# Patient Record
Sex: Male | Born: 1995 | Race: Black or African American | Hispanic: No | Marital: Single
Health system: Southern US, Community
[De-identification: ages and names within clinical notes are randomized; demographics above are authoritative.]

## PROBLEM LIST (undated history)

## (undated) DIAGNOSIS — G43909 Migraine, unspecified, not intractable, without status migrainosus: Secondary | ICD-10-CM

## (undated) HISTORY — PX: OTHER SURGICAL HISTORY: SHX169

## (undated) HISTORY — PX: TONSILLECTOMY: SUR1361

---
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
## 2014-01-17 IMAGING — CT CT HEAD W/O CM
1 of 2 series · 13 of 30 positions shown, 17 images · non-contrast
Comparison: None.

CLINICAL DATA: Wrestling injury, headache, photophobia.

CT HEAD WITHOUT CONTRAST
TECHNIQUE: Contiguous axial images were obtained from the base of
the skull through the vertex without contrast

[Series 2: brain · axial · 0.47mm/px · z∈[+191,+322]mm · 13 of 28 slices shown, 17 images]
[im 2/28  brain]
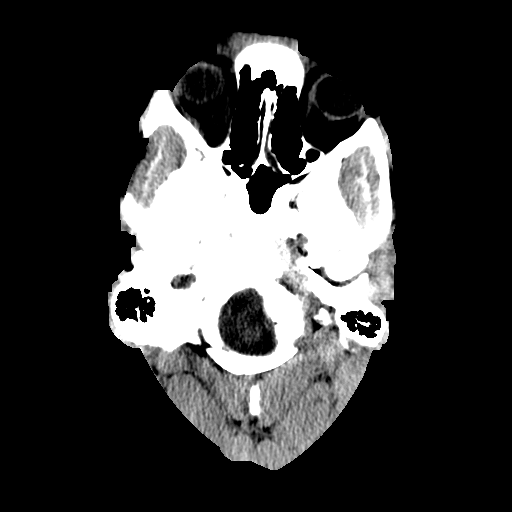
[im 2/28  bone]
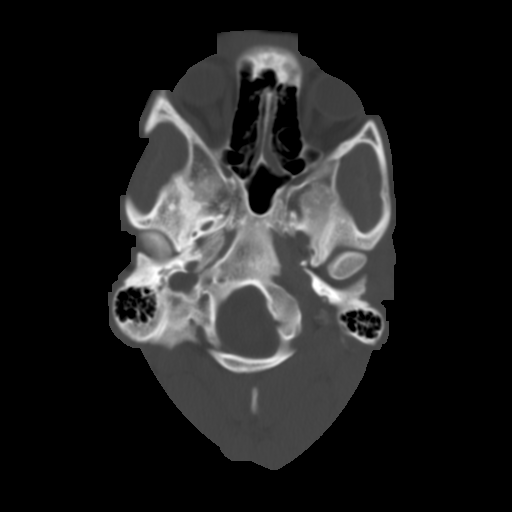
[im 4/28  brain]
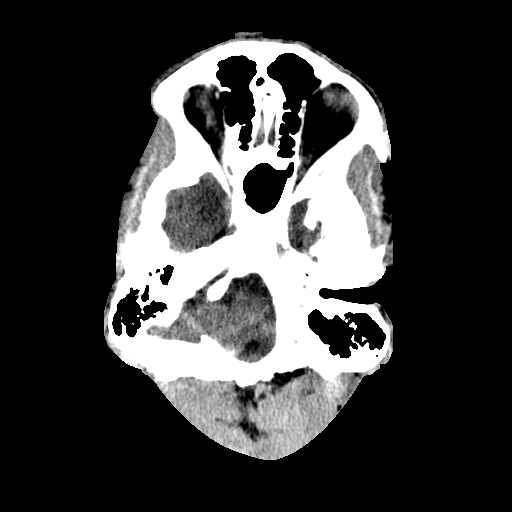
[im 6/28  brain]
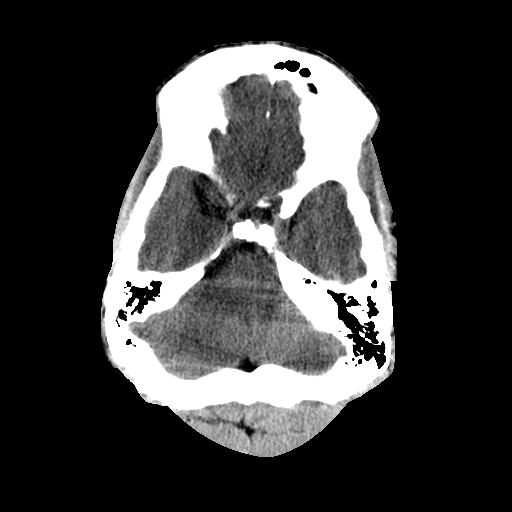
[im 8/28  brain]
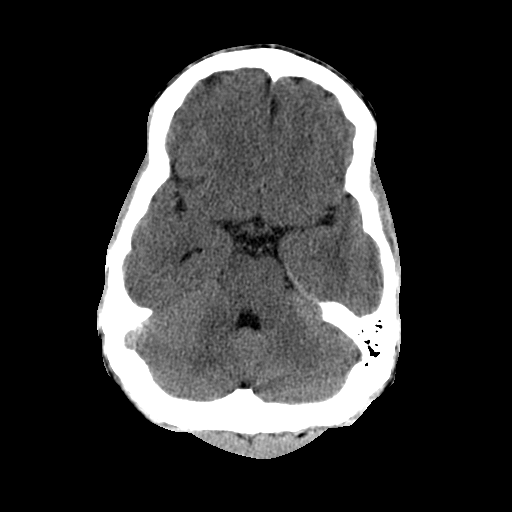
[im 10/28  brain]
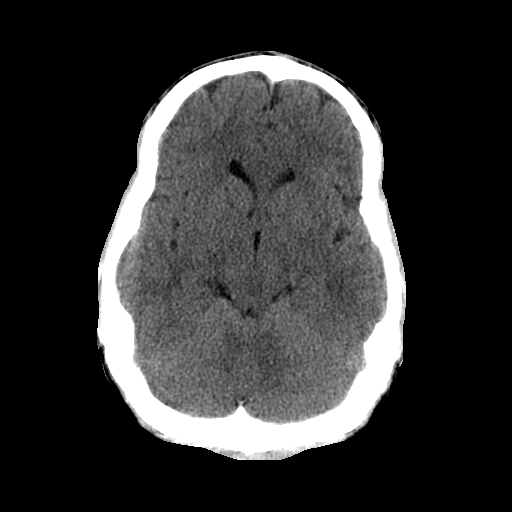
[im 10/28  bone]
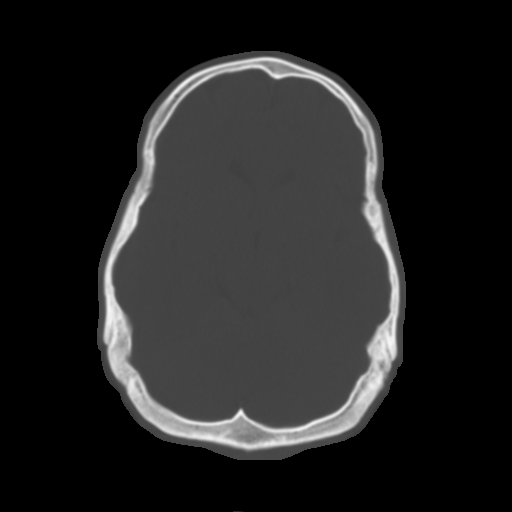
[im 12/28  brain]
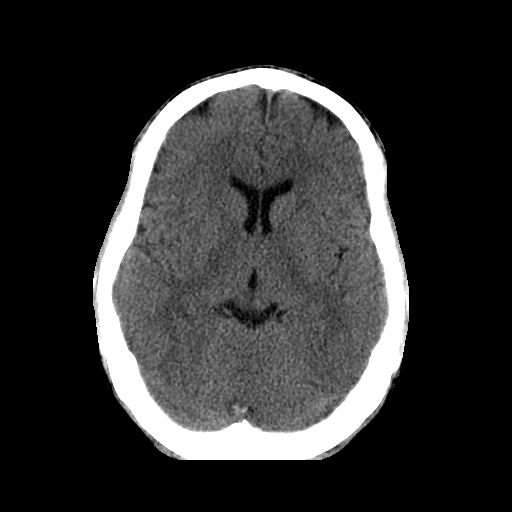
[im 14/28  brain]
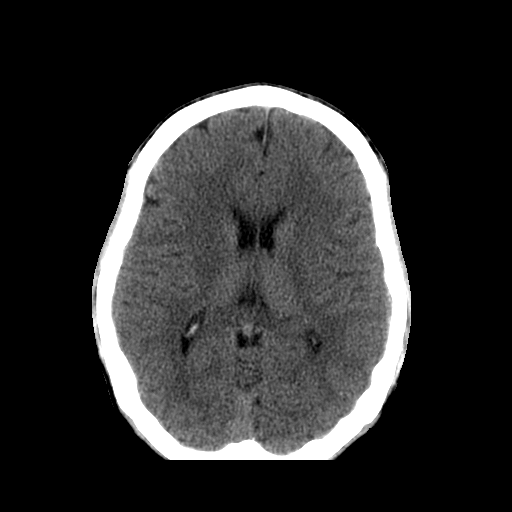
[im 16/28  brain]
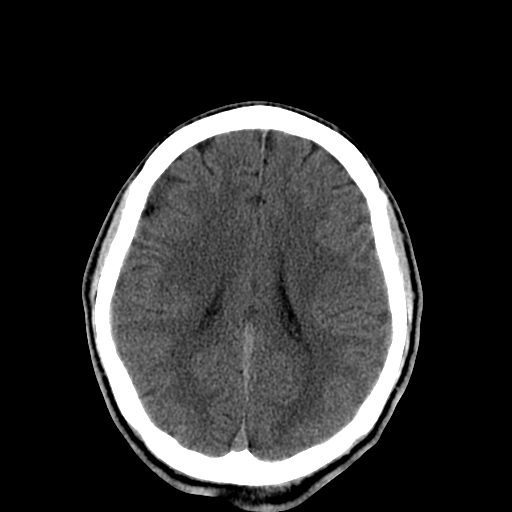
[im 18/28  brain]
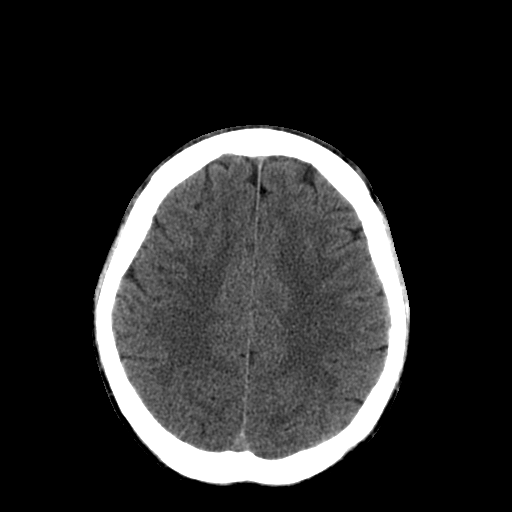
[im 18/28  bone]
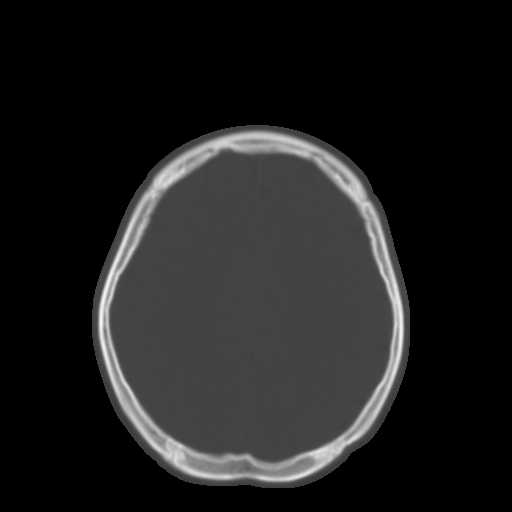
[im 20/28  brain]
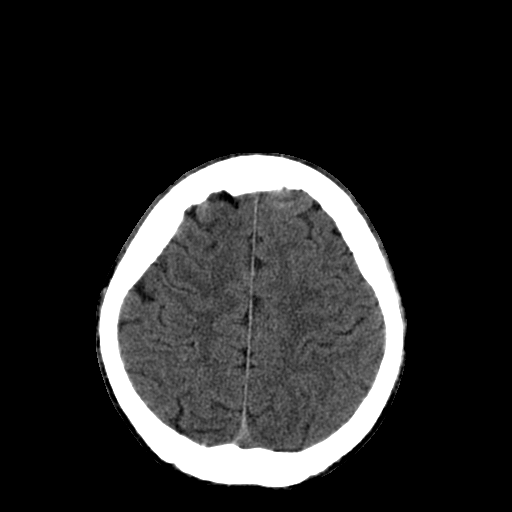
[im 22/28  brain]
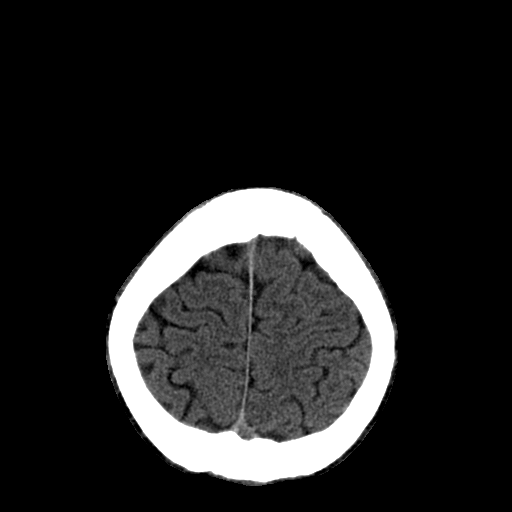
[im 24/28  brain]
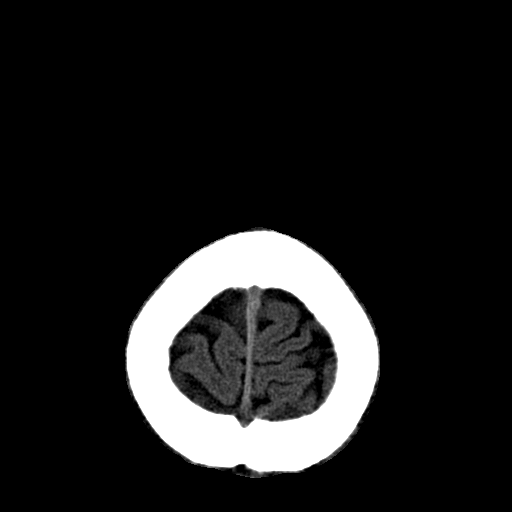
[im 26/28  brain]
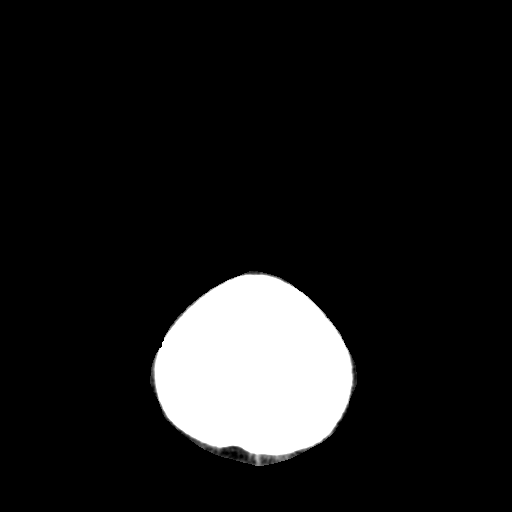
[im 26/28  bone]
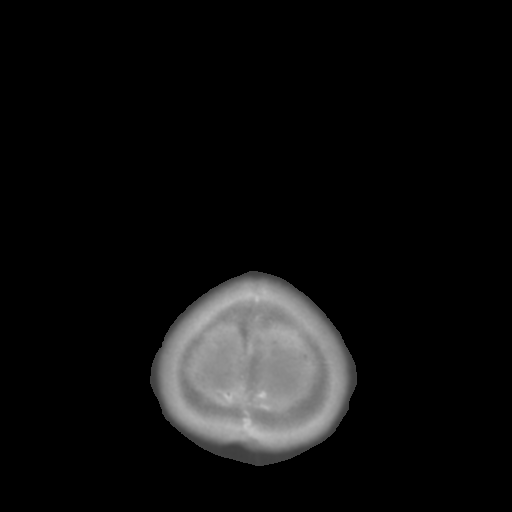

[13 of 30 positions shown; findings below may reference images not displayed]

FINDINGS: The brain has a normal appearance without evidence for
hemorrhage, acute infarction, hydrocephalus, or mass lesion.  There
is no extra axial fluid collection.  The skull and paranasal
sinuses are normal.
IMPRESSION: Normal CT of the head without contrast.

## 2014-01-17 IMAGING — CR DG CERVICAL SPINE COMPLETE 4+V
5 series · 5 of 5 positions shown · non-contrast
Comparison: None.

CLINICAL DATA: Head injury, neck pain, wrestling.

CERVICAL SPINE - 4+ VIEWS

[w c-spine lat]
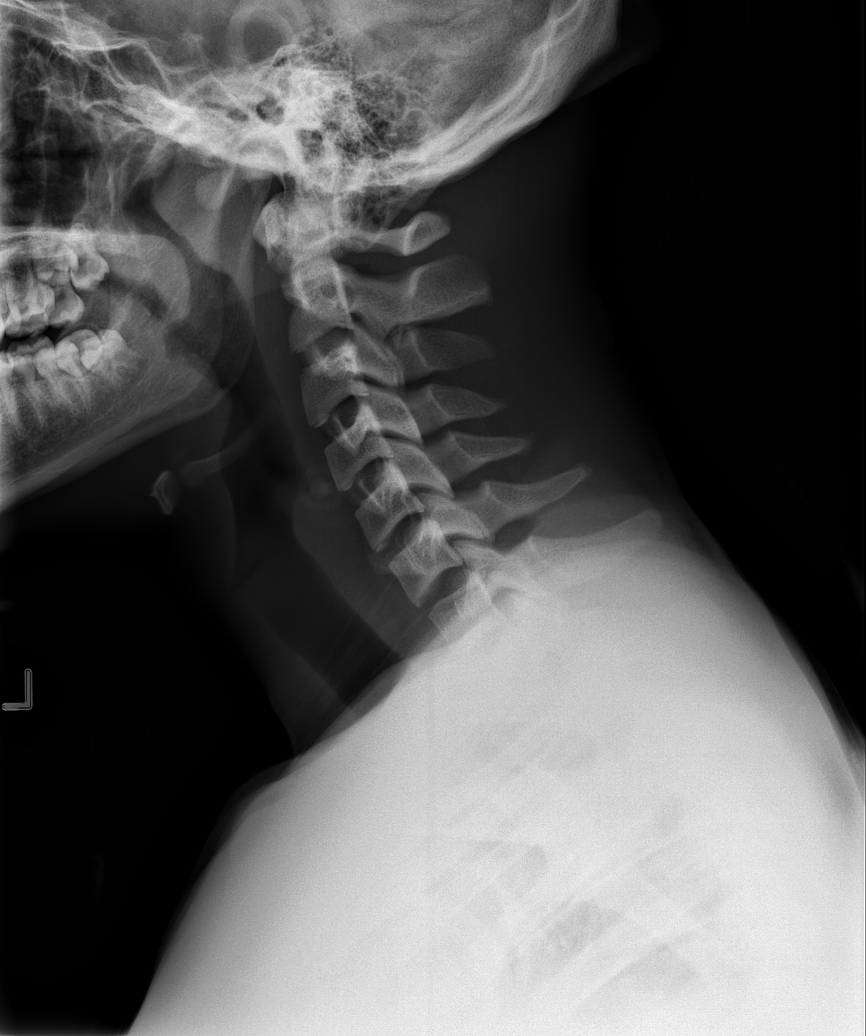

[w c-spine oblique (1 of 2)]
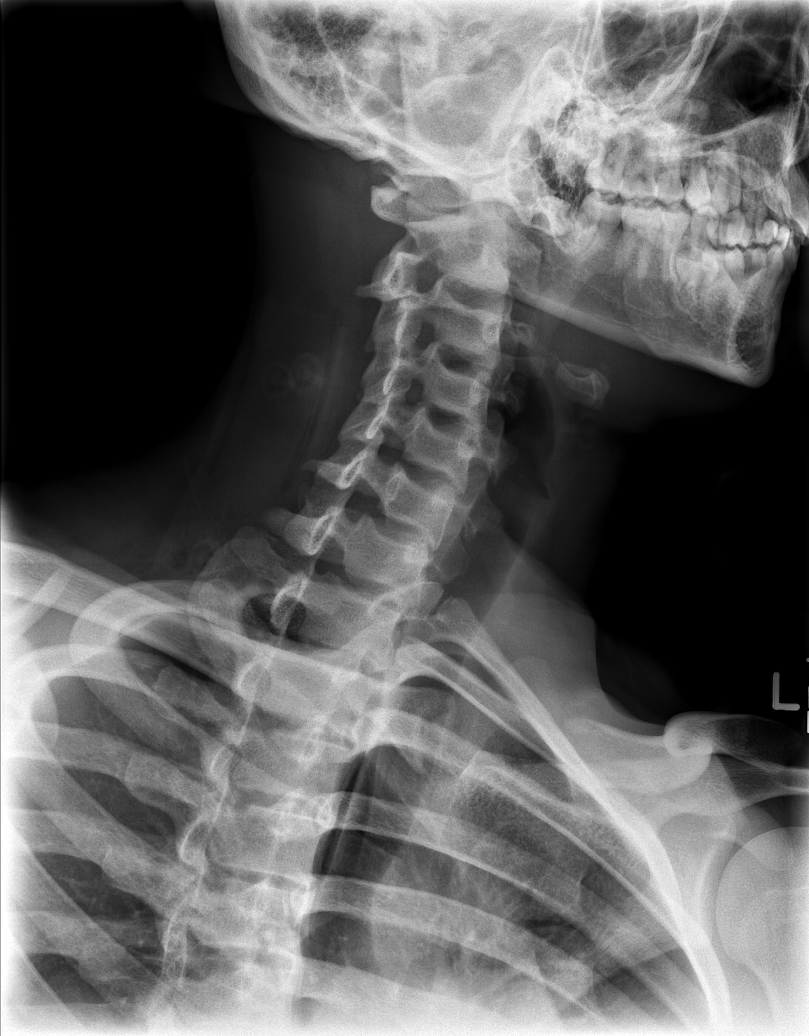

[w c-spine oblique (2 of 2)]
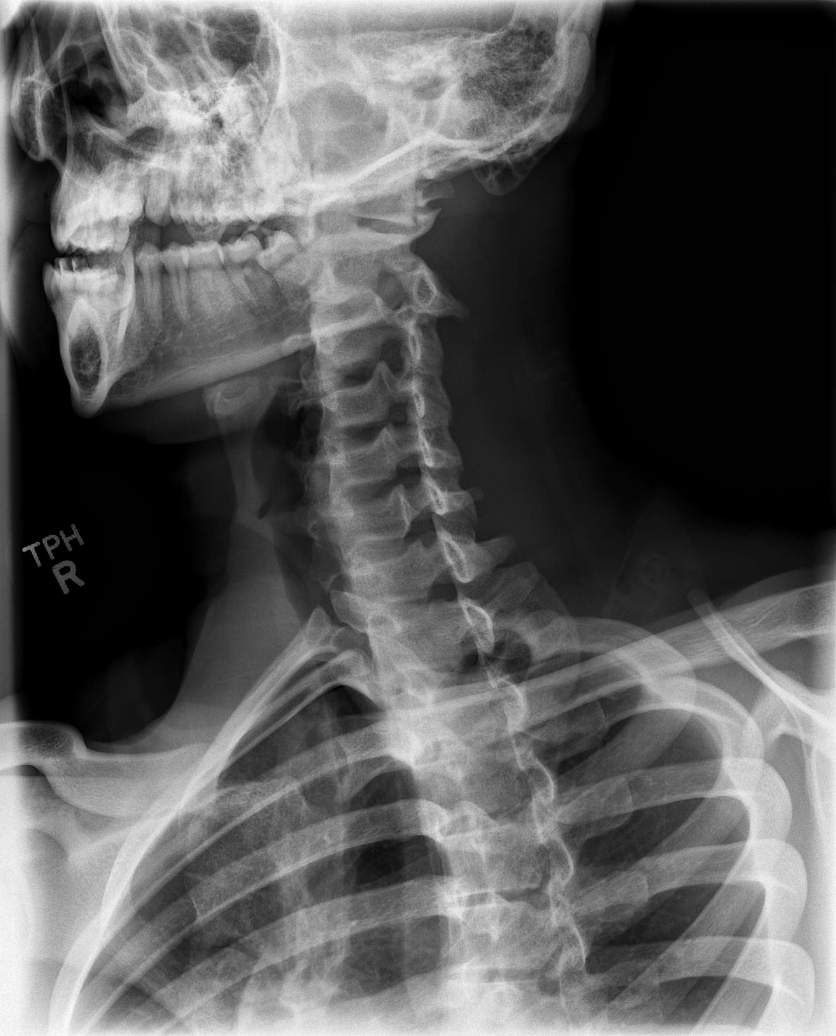

[w c-spine a.p.]
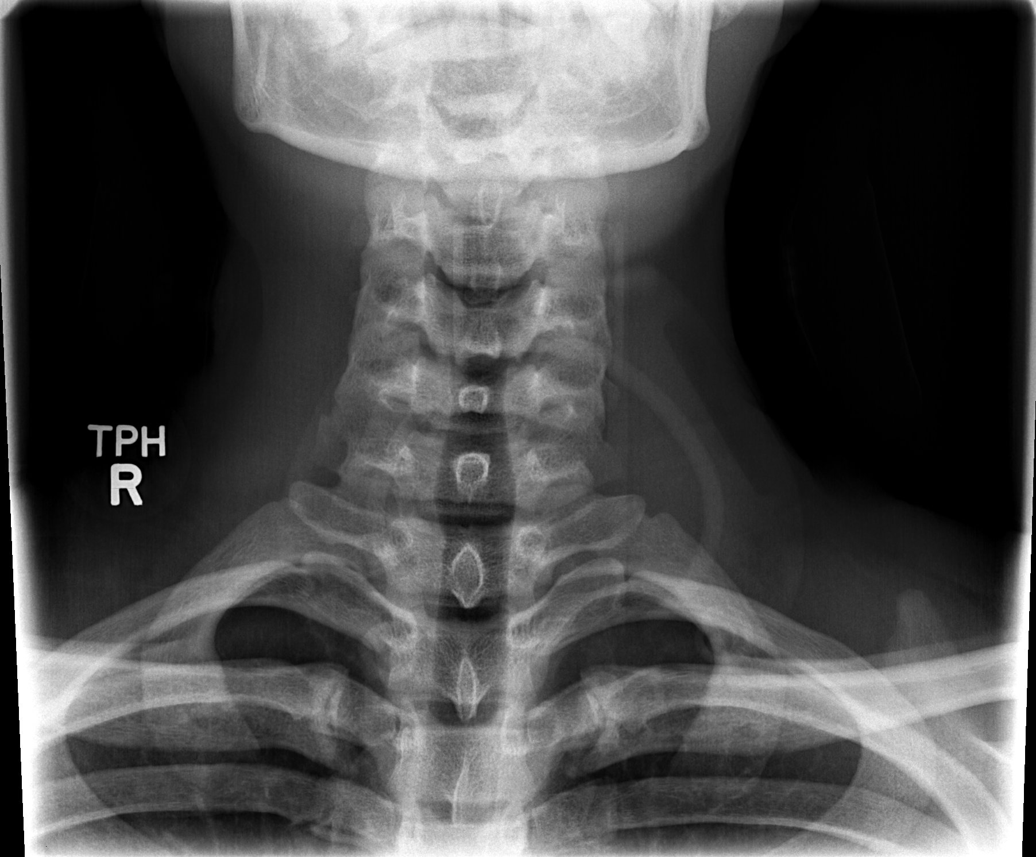

[t c-spine odontoid]
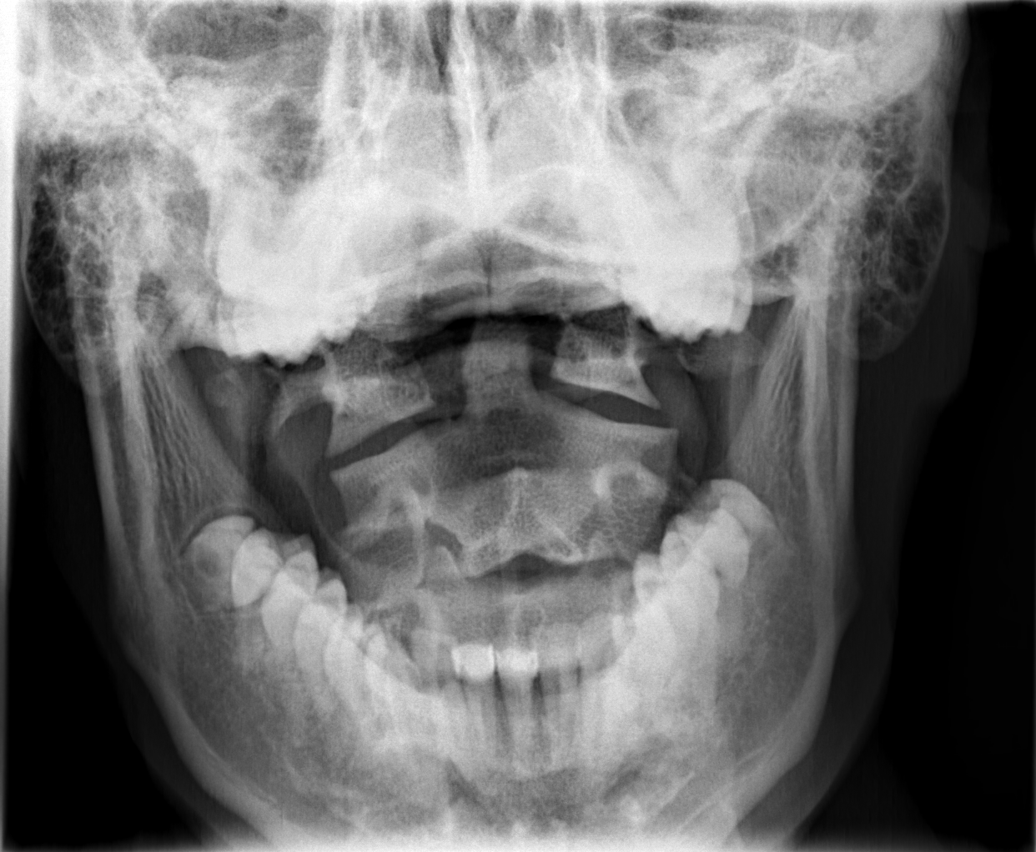

[5 of 5 positions shown; findings below may reference images not displayed]

FINDINGS: There is no evidence of cervical spine fracture or
prevertebral soft tissue swelling.  Alignment is normal.  No other
significant bone abnormalities are identified.
IMPRESSION: Negative cervical spine radiographs.

## 2016-04-07 ENCOUNTER — Encounter (HOSPITAL_COMMUNITY): Payer: Self-pay

## 2016-04-07 ENCOUNTER — Emergency Department (HOSPITAL_COMMUNITY)
Admission: EM | Admit: 2016-04-07 | Discharge: 2016-04-07 | Disposition: A | Discharge status: Home / Self Care | Payer: BLUE CROSS/BLUE SHIELD | Attending: Emergency Medicine | Admitting: Emergency Medicine

## 2016-04-07 DIAGNOSIS — Z8679 Personal history of other diseases of the circulatory system: Secondary | ICD-10-CM | POA: Diagnosis not present

## 2016-04-07 DIAGNOSIS — J309 Allergic rhinitis, unspecified: Secondary | ICD-10-CM | POA: Insufficient documentation

## 2016-04-07 DIAGNOSIS — R0981 Nasal congestion: Secondary | ICD-10-CM | POA: Diagnosis present

## 2016-04-07 DIAGNOSIS — Z792 Long term (current) use of antibiotics: Secondary | ICD-10-CM | POA: Insufficient documentation

## 2016-04-07 DIAGNOSIS — J019 Acute sinusitis, unspecified: Secondary | ICD-10-CM | POA: Diagnosis not present

## 2016-04-07 MED ORDER — MONTELUKAST SODIUM 10 MG PO TABS: 10.0000 mg | ORAL_TABLET | Freq: Every day | ORAL | Status: DC

## 2016-04-07 MED ORDER — FLUTICASONE PROPIONATE 50 MCG/ACT NA SUSP: 2.0000 | Freq: Every day | NASAL | Status: DC

## 2016-04-07 MED ORDER — CETIRIZINE HCL 10 MG PO TABS: 10.0000 mg | ORAL_TABLET | Freq: Every day | ORAL | Status: AC

## 2016-04-07 NOTE — ED Notes (Signed)
Patient here with 1 week of nasal congestion and sneezing x 1 week. Taking otc meds with minimal relief

## 2016-04-07 NOTE — ED Provider Notes (Signed)
CSN: [id number]     Arrival date & time 04/07/16  1043 History  By signing my name below, I, Ronney LionSuzanne Le, attest that this documentation has been prepared under the direction and in the presence of Danelle BerryLeisa Trea Latner, PA-C. Electronically Signed: Ronney LionSuzanne Le, ED Scribe. 04/07/2016. 1:11 PM.   Chief Complaint  Patient presents with  . Nasal Congestion   The history is provided by the patient. No language interpreter was used.    HPI Comments: Dustin Miller is a 20 y.o. male with a history of migraine and tonsillectomy, who presents to the Emergency Department complaining of gradual-onset, constant, nasal congestion that began 8 days ago. Patient also complains of associated rhinorrhea and sneezing. Patient states he had tried Advil and Allegra, with no relief. He denies any pain, fever, cough, SOB, CP, abdominal pain, rash, N, V.  No ear pain, facial pain or sore throat.  Past Medical History  Diagnosis Date  . Migraine    Past Surgical History  Procedure Laterality Date  . Tonsillectomy    . Lymph nodes remove    . Tubes in the ear    . Pyloric stenosis repair     Family History  Problem Relation Age of Onset  . Heart disease Mother    Social History  Substance Use Topics  . Smoking status: Never Smoker   . Smokeless tobacco: None  . Alcohol Use: No    Review of Systems  Constitutional: Negative for fever and chills.  HENT: Positive for congestion, rhinorrhea and sneezing. Negative for sore throat.   Respiratory: Negative for cough.   Musculoskeletal: Negative for myalgias.  All other systems reviewed and are negative.     Allergies  Review of patient's allergies indicates no known allergies.  Home Medications   Prior to Admission medications   Medication Sig Start Date End Date Taking? Authorizing Provider  cephALEXin (KEFLEX) 500 MG capsule Take 1 capsule (500 mg total) by mouth 2 (two) times daily. 12/03/13   Heather Jaquita RectorM Marte, PA-C  cetirizine (ZYRTEC ALLERGY) 10 MG  tablet Take 1 tablet (10 mg total) by mouth daily. 04/07/16   Danelle BerryLeisa Johnell Bas, PA-C  fluticasone (FLONASE) 50 MCG/ACT nasal spray Place 2 sprays into both nostrils daily. 04/07/16   Danelle BerryLeisa Tallia Moehring, PA-C  montelukast (SINGULAIR) 10 MG tablet Take 1 tablet (10 mg total) by mouth at bedtime. 04/07/16   Danelle BerryLeisa Shantinique Picazo, PA-C   BP 133/83 mmHg  Pulse 63  Temp(Src) 98.3 F (36.8 C) (Oral)  Resp 18  SpO2 100% Physical Exam  Constitutional: He is oriented to person, place, and time. He appears well-developed and well-nourished. No distress.  HENT:  Head: Normocephalic and atraumatic.  Right Ear: External ear normal.  Left Ear: External ear normal.  Nose: Nose normal.  Mouth/Throat: Oropharynx is clear and moist. No oropharyngeal exudate.  Bilateral TM's normal in appearance Nasal mucosa edematous, with clear discharge No maxillary or frontal sinus ttp Posterior oropharynx w/o edema or erythema No cervical lymphadenopathy.  Eyes: Conjunctivae and EOM are normal. Pupils are equal, round, and reactive to light. Right eye exhibits no discharge. Left eye exhibits no discharge. No scleral icterus.  Neck: Normal range of motion. Neck supple. No JVD present. No tracheal deviation present.  Cardiovascular: Normal rate and regular rhythm.   Pulmonary/Chest: Effort normal and breath sounds normal. No stridor. No respiratory distress.  Musculoskeletal: Normal range of motion. He exhibits no edema.  Lymphadenopathy:    He has no cervical adenopathy.  Neurological: He is alert  and oriented to person, place, and time. He exhibits normal muscle tone. Coordination normal.  Skin: Skin is warm and dry. No rash noted. He is not diaphoretic. No erythema. No pallor.  Psychiatric: He has a normal mood and affect. His behavior is normal. Judgment and thought content normal.  Nursing note and vitals reviewed.   ED Course  Procedures (including critical care time)  DIAGNOSTIC STUDIES: Oxygen Saturation is 100% on RA, normal  by my interpretation.    COORDINATION OF CARE: 1:04 PM - Discussed treatment plan with pt at bedside, which includes Zyrtec, Flonase, and Singulair. Pt verbalized understanding and agreed to plan.   MDM   20 y/o male with nasal congestion, is well appearing, hemodynamically stable. Discharged in good condition with flonase and zyrtec, encouraged to try sudafed, and if not improving add singulair for congestion and seasonal allergies.    Final diagnoses:  Allergic rhinitis, unspecified allergic rhinitis type  Acute sinusitis, recurrence not specified, unspecified location    I personally performed the services described in this documentation, which was scribed in my presence. The recorded information has been reviewed and is accurate.       Danelle Berry, PA-C 04/08/16 1300  Arby Barrette, MD 04/09/16 [phone]

## 2016-04-07 NOTE — Discharge Instructions (Signed)
Allergic Rhinitis Allergic rhinitis is when the mucous membranes in the nose respond to allergens. Allergens are particles in the air that cause your body to have an allergic reaction. This causes you to release allergic antibodies. Through a chain of events, these eventually cause you to release histamine into the blood stream. Although meant to protect the body, it is this release of histamine that causes your discomfort, such as frequent sneezing, congestion, and an itchy, runny nose.  CAUSES Seasonal allergic rhinitis (hay fever) is caused by pollen allergens that may come from grasses, trees, and weeds. Year-round allergic rhinitis (perennial allergic rhinitis) is caused by allergens such as house dust mites, pet dander, and mold spores. SYMPTOMS  Nasal stuffiness (congestion).  Itchy, runny nose with sneezing and tearing of the eyes. DIAGNOSIS Your health care provider can help you determine the allergen or allergens that trigger your symptoms. If you and your health care provider are unable to determine the allergen, skin or blood testing may be used. Your health care provider will diagnose your condition after taking your health history and performing a physical exam. Your health care provider may assess you for other related conditions, such as asthma, pink eye, or an ear infection. TREATMENT Allergic rhinitis does not have a cure, but it can be controlled by:  Medicines that block allergy symptoms. These may include allergy shots, nasal sprays, and oral antihistamines.  Avoiding the allergen. Hay fever may often be treated with antihistamines in pill or nasal spray forms. Antihistamines block the effects of histamine. There are over-the-counter medicines that may help with nasal congestion and swelling around the eyes. Check with your health care provider before taking or giving this medicine. If avoiding the allergen or the medicine prescribed do not work, there are many new medicines  your health care provider can prescribe. Stronger medicine may be used if initial measures are ineffective. Desensitizing injections can be used if medicine and avoidance does not work. Desensitization is when a patient is given ongoing shots until the body becomes less sensitive to the allergen. Make sure you follow up with your health care provider if problems continue. HOME CARE INSTRUCTIONS It is not possible to completely avoid allergens, but you can reduce your symptoms by taking steps to limit your exposure to them. It helps to know exactly what you are allergic to so that you can avoid your specific triggers. SEEK MEDICAL CARE IF:  You have a fever.  You develop a cough that does not stop easily (persistent).  You have shortness of breath.  You start wheezing.  Symptoms interfere with normal daily activities.   This information is not intended to replace advice given to you by your health care provider. Make sure you discuss any questions you have with your health care provider.   Document Released: 07/28/2001 Document Revised: 11/23/2014 Document Reviewed: 07/10/2013 Elsevier Interactive Patient Education 2016 Elsevier Inc. Sinusitis, Adult Sinusitis is redness, soreness, and inflammation of the paranasal sinuses. Paranasal sinuses are air pockets within the bones of your face. They are located beneath your eyes, in the middle of your forehead, and above your eyes. In healthy paranasal sinuses, mucus is able to drain out, and air is able to circulate through them by way of your nose. However, when your paranasal sinuses are inflamed, mucus and air can become trapped. This can allow bacteria and other germs to grow and cause infection. Sinusitis can develop quickly and last only a short time (acute) or continue over a long   period (chronic). Sinusitis that lasts for more than 12 weeks is considered chronic. CAUSES Causes of sinusitis include:  Allergies.  Structural abnormalities,  such as displacement of the cartilage that separates your nostrils (deviated septum), which can decrease the air flow through your nose and sinuses and affect sinus drainage.  Functional abnormalities, such as when the small hairs (cilia) that line your sinuses and help remove mucus do not work properly or are not present. SIGNS AND SYMPTOMS Symptoms of acute and chronic sinusitis are the same. The primary symptoms are pain and pressure around the affected sinuses. Other symptoms include:  Upper toothache.  Earache.  Headache.  Bad breath.  Decreased sense of smell and taste.  A cough, which worsens when you are lying flat.  Fatigue.  Fever.  Thick drainage from your nose, which often is green and may contain pus (purulent).  Swelling and warmth over the affected sinuses. DIAGNOSIS Your health care provider will perform a physical exam. During your exam, your health care provider may perform any of the following to help determine if you have acute sinusitis or chronic sinusitis:  Look in your nose for signs of abnormal growths in your nostrils (nasal polyps).  Tap over the affected sinus to check for signs of infection.  View the inside of your sinuses using an imaging device that has a light attached (endoscope). If your health care provider suspects that you have chronic sinusitis, one or more of the following tests may be recommended:  Allergy tests.  Nasal culture. A sample of mucus is taken from your nose, sent to a lab, and screened for bacteria.  Nasal cytology. A sample of mucus is taken from your nose and examined by your health care provider to determine if your sinusitis is related to an allergy. TREATMENT Most cases of acute sinusitis are related to a viral infection and will resolve on their own within 10 days. Sometimes, medicines are prescribed to help relieve symptoms of both acute and chronic sinusitis. These may include pain medicines, decongestants, nasal  steroid sprays, or saline sprays. However, for sinusitis related to a bacterial infection, your health care provider will prescribe antibiotic medicines. These are medicines that will help kill the bacteria causing the infection. Rarely, sinusitis is caused by a fungal infection. In these cases, your health care provider will prescribe antifungal medicine. For some cases of chronic sinusitis, surgery is needed. Generally, these are cases in which sinusitis recurs more than 3 times per year, despite other treatments. HOME CARE INSTRUCTIONS  Drink plenty of water. Water helps thin the mucus so your sinuses can drain more easily.  Use a humidifier.  Inhale steam 3-4 times a day (for example, sit in the bathroom with the shower running).  Apply a warm, moist washcloth to your face 3-4 times a day, or as directed by your health care provider.  Use saline nasal sprays to help moisten and clean your sinuses.  Take medicines only as directed by your health care provider.  If you were prescribed either an antibiotic or antifungal medicine, finish it all even if you start to feel better. SEEK IMMEDIATE MEDICAL CARE IF:  You have increasing pain or severe headaches.  You have nausea, vomiting, or drowsiness.  You have swelling around your face.  You have vision problems.  You have a stiff neck.  You have difficulty breathing.   This information is not intended to replace advice given to you by your health care provider. Make sure you discuss   any questions you have with your health care provider.   Document Released: 11/02/2005 Document Revised: 11/23/2014 Document Reviewed: 11/17/2011 Elsevier Interactive Patient Education 2016 Elsevier Inc.  

## 2016-04-07 NOTE — ED Notes (Signed)
Pt is in stable condition upon d/c and ambulates from ED. 

## 2020-05-18 ENCOUNTER — Other Ambulatory Visit: Payer: Self-pay

## 2020-05-18 ENCOUNTER — Encounter (HOSPITAL_COMMUNITY): Payer: Self-pay | Admitting: Emergency Medicine

## 2020-05-18 ENCOUNTER — Emergency Department (HOSPITAL_COMMUNITY)
Admission: EM | Admit: 2020-05-18 | Discharge: 2020-05-19 | Disposition: A | Discharge status: Other Acute Inpatient Hospital | Payer: BC Managed Care – PPO | Source: Home / Self Care | Attending: Emergency Medicine | Admitting: Emergency Medicine

## 2020-05-18 DIAGNOSIS — Z818 Family history of other mental and behavioral disorders: Secondary | ICD-10-CM | POA: Diagnosis not present

## 2020-05-18 DIAGNOSIS — F322 Major depressive disorder, single episode, severe without psychotic features: Secondary | ICD-10-CM | POA: Diagnosis not present

## 2020-05-18 DIAGNOSIS — R7989 Other specified abnormal findings of blood chemistry: Secondary | ICD-10-CM | POA: Insufficient documentation

## 2020-05-18 DIAGNOSIS — F419 Anxiety disorder, unspecified: Secondary | ICD-10-CM | POA: Diagnosis not present

## 2020-05-18 DIAGNOSIS — F10239 Alcohol dependence with withdrawal, unspecified: Secondary | ICD-10-CM | POA: Diagnosis not present

## 2020-05-18 DIAGNOSIS — Y902 Blood alcohol level of 40-59 mg/100 ml: Secondary | ICD-10-CM | POA: Diagnosis not present

## 2020-05-18 DIAGNOSIS — R45851 Suicidal ideations: Secondary | ICD-10-CM

## 2020-05-18 DIAGNOSIS — F1910 Other psychoactive substance abuse, uncomplicated: Secondary | ICD-10-CM | POA: Insufficient documentation

## 2020-05-18 DIAGNOSIS — F1120 Opioid dependence, uncomplicated: Secondary | ICD-10-CM | POA: Diagnosis not present

## 2020-05-18 DIAGNOSIS — G4700 Insomnia, unspecified: Secondary | ICD-10-CM | POA: Diagnosis not present

## 2020-05-18 DIAGNOSIS — F1920 Other psychoactive substance dependence, uncomplicated: Secondary | ICD-10-CM | POA: Diagnosis present

## 2020-05-18 DIAGNOSIS — F332 Major depressive disorder, recurrent severe without psychotic features: Secondary | ICD-10-CM

## 2020-05-18 DIAGNOSIS — Z20822 Contact with and (suspected) exposure to covid-19: Secondary | ICD-10-CM | POA: Insufficient documentation

## 2020-05-18 LAB — COMPREHENSIVE METABOLIC PANEL
ALT: 57 U/L — ABNORMAL HIGH (ref 0–44)
AST: 45 U/L — ABNORMAL HIGH (ref 15–41)
Albumin: 4.8 g/dL (ref 3.5–5.0)
Alkaline Phosphatase: 64 U/L (ref 38–126)
Anion gap: 13 (ref 5–15)
BUN: 12 mg/dL (ref 6–20)
CO2: 24 mmol/L (ref 22–32)
Calcium: 9.9 mg/dL (ref 8.9–10.3)
Chloride: 100 mmol/L (ref 98–111)
Creatinine, Ser: 1.34 mg/dL — ABNORMAL HIGH (ref 0.61–1.24)
GFR calc Af Amer: >60 mL/min (ref >60)
GFR calc non Af Amer: >60 mL/min (ref >60)
Glucose, Bld: 109 mg/dL — ABNORMAL HIGH (ref 70–99)
Potassium: 4.3 mmol/L (ref 3.5–5.1)
Sodium: 137 mmol/L (ref 135–145)
Total Bilirubin: 1.6 mg/dL — ABNORMAL HIGH (ref 0.3–1.2)
Total Protein: 8 g/dL (ref 6.5–8.1)

## 2020-05-18 LAB — CBC WITH DIFFERENTIAL/PLATELET
Abs Immature Granulocytes: 0.01 10*3/uL (ref 0.00–0.07)
Basophils Absolute: 0 10*3/uL (ref 0.0–0.1)
Basophils Relative: 0 %
Eosinophils Absolute: 0 10*3/uL (ref 0.0–0.5)
Eosinophils Relative: 0 %
HCT: 47.4 % (ref 39.0–52.0)
Hemoglobin: 16.1 g/dL (ref 13.0–17.0)
Immature Granulocytes: 0 %
Lymphocytes Relative: 26 %
Lymphs Abs: 1.5 10*3/uL (ref 0.7–4.0)
MCH: 31.9 pg (ref 26.0–34.0)
MCHC: 34 g/dL (ref 30.0–36.0)
MCV: 93.9 fL (ref 80.0–100.0)
Monocytes Absolute: 0.3 10*3/uL (ref 0.1–1.0)
Monocytes Relative: 5 %
Neutro Abs: 3.9 10*3/uL (ref 1.7–7.7)
Neutrophils Relative %: 69 %
Platelets: 385 10*3/uL (ref 150–400)
RBC: 5.05 MIL/uL (ref 4.22–5.81)
RDW: 12.8 % (ref 11.5–15.5)
WBC: 5.7 10*3/uL (ref 4.0–10.5)
nRBC: 0 % (ref 0.0–0.2)

## 2020-05-18 LAB — RAPID URINE DRUG SCREEN, HOSP PERFORMED
Amphetamines: POSITIVE — AB
Barbiturates: NOT DETECTED
Benzodiazepines: NOT DETECTED
Cocaine: NOT DETECTED
Opiates: POSITIVE — AB
Tetrahydrocannabinol: POSITIVE — AB

## 2020-05-18 LAB — ETHANOL: Alcohol, Ethyl (B): 44 mg/dL — ABNORMAL HIGH (ref <10)

## 2020-05-18 MED ORDER — LORAZEPAM 1 MG PO TABS
1.0000 mg | ORAL_TABLET | Freq: Once | ORAL | Status: AC
  Administered 2020-05-18: 1 mg via ORAL
  Filled 2020-05-18: qty 1

## 2020-05-18 MED ORDER — SODIUM CHLORIDE 0.9 % IV BOLUS
1000.0000 mL | Freq: Once | INTRAVENOUS | Status: AC
  Administered 2020-05-18: 1000 mL via INTRAVENOUS

## 2020-05-18 NOTE — ED Triage Notes (Signed)
Patient here from home requesting detox from "everything".
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
## 2020-05-18 NOTE — ED Provider Notes (Signed)
[facility]-EMERGENCY DEPT Provider Note   CSN: [id number] Arrival date & time: 05/18/20  1537     History Chief Complaint  Patient presents with  . Detox    Dustin Miller is a 24 y.o. male.  Patient is a 24 year old male who presents wanting detox.  He says that he is "hyped up on everything".  He drinks alcohol daily.  His last intake was about an hour ago.  He cannot quantify exactly how much but he drinks says "too much".  He drinks beer but also liquor.  He also does ecstasy, opioids and cocaine which he also did today.  Right now he is feeling very anxious and hyped up on drugs.  He is having some thoughts of wanting to hurt himself but does not have a specific plan.  No hallucinations.  Other than feeling anxious and feel like his heart is racing, he denies any recent illnesses or other medical complaints.        Past Medical History:  Diagnosis Date  . Migraine     There are no problems to display for this patient.   Past Surgical History:  Procedure Laterality Date  . lymph nodes remove    . pyloric stenosis repair    . TONSILLECTOMY    . tubes in the ear         Family History  Problem Relation Age of Onset  . Heart disease Mother     Social History   Tobacco Use  . Smoking status: Never Smoker  . Smokeless tobacco: Never Used  Substance Use Topics  . Alcohol use: No  . Drug use: Yes    Types: Cocaine, Marijuana, Methamphetamines    Comment: Pills, Ectasy, Molly, Percs, Bars, "White Girl"    Home Medications Prior to Admission medications   Medication Sig Start Date End Date Taking? Authorizing Provider  cetirizine (ZYRTEC ALLERGY) 10 MG tablet Take 1 tablet (10 mg total) by mouth daily. Patient not taking: Reported on 05/18/2020 04/07/16   Danelle Berry, PA-C  fluticasone [facility]) 50 MCG/ACT nasal spray Place 2 sprays into both nostrils daily. Patient not taking: Reported on 05/18/2020 04/07/16   Danelle Berry, PA-C    montelukast (SINGULAIR) 10 MG tablet Take 1 tablet (10 mg total) by mouth at bedtime. Patient not taking: Reported on 05/18/2020 04/07/16   Danelle Berry, PA-C    Allergies    Patient has no known allergies.  Review of Systems   Review of Systems  Constitutional: Negative for chills, diaphoresis, fatigue and fever.  HENT: Negative for congestion, rhinorrhea and sneezing.   Eyes: Negative.   Respiratory: Negative for cough, chest tightness and shortness of breath.   Cardiovascular: Positive for palpitations. Negative for chest pain and leg swelling.  Gastrointestinal: Negative for abdominal pain, blood in stool, diarrhea, nausea and vomiting.  Genitourinary: Negative for difficulty urinating, flank pain, frequency and hematuria.  Musculoskeletal: Negative for arthralgias and back pain.  Skin: Negative for rash.  Neurological: Negative for dizziness, speech difficulty, weakness, numbness and headaches.  Psychiatric/Behavioral: Positive for suicidal ideas. Negative for hallucinations. The patient is nervous/anxious.     Physical Exam Updated Vital Signs BP 125/88   Pulse (!) 114   Temp 98.2 F (36.8 C) (Oral)   Resp (!) 27   Ht 5\' 10"  (1.778 m)   Wt 72.6 kg   SpO2 100%   BMI 22.96 kg/m   Physical Exam Constitutional:      Appearance: He is well-developed.  HENT:     Head: Normocephalic and atraumatic.  Eyes:     Pupils: Pupils are equal, round, and reactive to light.  Cardiovascular:     Rate and Rhythm: Regular rhythm. Tachycardia present.     Heart sounds: Normal heart sounds.  Pulmonary:     Effort: Pulmonary effort is normal. No respiratory distress.     Breath sounds: Normal breath sounds. No wheezing or rales.  Chest:     Chest wall: No tenderness.  Abdominal:     General: Bowel sounds are normal.     Palpations: Abdomen is soft.     Tenderness: There is no abdominal tenderness. There is no guarding or rebound.  Musculoskeletal:        General: Normal range of  motion.     Cervical back: Normal range of motion and neck supple.  Lymphadenopathy:     Cervical: No cervical adenopathy.  Skin:    General: Skin is warm and dry.     Findings: No rash.  Neurological:     Mental Status: He is alert and oriented to person, place, and time.  Psychiatric:        Mood and Affect: Mood is anxious.        Speech: Speech is rapid and pressured.     ED Results / Procedures / Treatments   Labs (all labs ordered are listed, but only abnormal results are displayed) Labs Reviewed  COMPREHENSIVE METABOLIC PANEL - Abnormal; Notable for the following components:      Result Value   Glucose, Bld 109 (*)    Creatinine, Ser 1.34 (*)    AST 45 (*)    ALT 57 (*)    Total Bilirubin 1.6 (*)    All other components within normal limits  ETHANOL - Abnormal; Notable for the following components:   Alcohol, Ethyl (B) 44 (*)    All other components within normal limits  RAPID URINE DRUG SCREEN, HOSP PERFORMED - Abnormal; Notable for the following components:   Opiates POSITIVE (*)    Amphetamines POSITIVE (*)    Tetrahydrocannabinol POSITIVE (*)    All other components within normal limits  SARS CORONAVIRUS 2 BY RT PCR [facility] ORDER, PERFORMED IN [facility])  CBC WITH DIFFERENTIAL/PLATELET    EKG EKG Interpretation  Date/Time:  Saturday May 18 2020 19:48:48 EDT Ventricular Rate:  139 PR Interval:    QRS Duration: 78 QT Interval:  287 QTC Calculation: 437 R Axis:   69 Text Interpretation: Sinus tachycardia Borderline repolarization abnormality 12 Lead; Mason-Likar No old tracing to compare Confirmed by Rolan Bucco [phone]) on 05/18/2020 7:59:41 PM   Radiology No results found.  Procedures Procedures (including critical care time)  Medications Ordered in ED Medications  sodium chloride 0.9 % bolus 1,000 mL (0 mLs Intravenous Stopped 05/18/20 2211)  LORazepam (ATIVAN) tablet 1 mg (1 mg Oral Given 05/18/20 2050)    ED Course  I have  reviewed the triage vital signs and the nursing notes.  Pertinent labs & imaging results that were available during my care of the patient were reviewed by me and considered in my medical decision making (see chart for details).    MDM Rules/Calculators/A&P                          Patient is a 24 year old male who presents with substance abuse in association with suicidal ideations.  He was markedly tachycardic on arrival.  He was  treated with IV fluids and Ativan.  His heart rate improved.  He is otherwise asymptomatic.  His other labs are nonconcerning.  His creatinine is mildly elevated.  He is medically cleared and awaiting TTS evaluation. Final Clinical Impression(s) / ED Diagnoses Final diagnoses:  Substance abuse (HCC)  Suicidal ideation    Rx / DC Orders ED Discharge Orders    None       Rolan Bucco, MD 05/19/20 0025

## 2020-05-19 ENCOUNTER — Inpatient Hospital Stay (HOSPITAL_COMMUNITY)
Admit: 2020-05-19 | Discharge: 2020-05-22 | DRG: 885 | Disposition: A | Discharge status: Home / Self Care | Payer: BC Managed Care – PPO | Source: Intra-hospital | Attending: Psychiatry | Admitting: Psychiatry

## 2020-05-19 ENCOUNTER — Encounter (HOSPITAL_COMMUNITY): Payer: Self-pay | Admitting: Nurse Practitioner

## 2020-05-19 DIAGNOSIS — F419 Anxiety disorder, unspecified: Secondary | ICD-10-CM | POA: Diagnosis present

## 2020-05-19 DIAGNOSIS — Z20822 Contact with and (suspected) exposure to covid-19: Secondary | ICD-10-CM | POA: Diagnosis present

## 2020-05-19 DIAGNOSIS — F1994 Other psychoactive substance use, unspecified with psychoactive substance-induced mood disorder: Secondary | ICD-10-CM

## 2020-05-19 DIAGNOSIS — Z818 Family history of other mental and behavioral disorders: Secondary | ICD-10-CM | POA: Diagnosis not present

## 2020-05-19 DIAGNOSIS — Y902 Blood alcohol level of 40-59 mg/100 ml: Secondary | ICD-10-CM | POA: Diagnosis present

## 2020-05-19 DIAGNOSIS — F332 Major depressive disorder, recurrent severe without psychotic features: Secondary | ICD-10-CM | POA: Diagnosis present

## 2020-05-19 DIAGNOSIS — F1920 Other psychoactive substance dependence, uncomplicated: Secondary | ICD-10-CM | POA: Diagnosis not present

## 2020-05-19 DIAGNOSIS — G4700 Insomnia, unspecified: Secondary | ICD-10-CM | POA: Diagnosis present

## 2020-05-19 DIAGNOSIS — F1120 Opioid dependence, uncomplicated: Secondary | ICD-10-CM | POA: Diagnosis present

## 2020-05-19 DIAGNOSIS — F10239 Alcohol dependence with withdrawal, unspecified: Secondary | ICD-10-CM | POA: Diagnosis present

## 2020-05-19 DIAGNOSIS — R45851 Suicidal ideations: Secondary | ICD-10-CM | POA: Diagnosis present

## 2020-05-19 DIAGNOSIS — F322 Major depressive disorder, single episode, severe without psychotic features: Secondary | ICD-10-CM | POA: Diagnosis not present

## 2020-05-19 DIAGNOSIS — F10230 Alcohol dependence with withdrawal, uncomplicated: Secondary | ICD-10-CM | POA: Diagnosis not present

## 2020-05-19 LAB — SARS CORONAVIRUS 2 BY RT PCR (HOSPITAL ORDER, PERFORMED IN ~~LOC~~ HOSPITAL LAB): SARS Coronavirus 2: NEGATIVE

## 2020-05-19 MED ORDER — ONDANSETRON 4 MG PO TBDP: 4.0000 mg | ORAL_TABLET | Freq: Four times a day (QID) | ORAL | Status: DC | PRN

## 2020-05-19 MED ORDER — THIAMINE HCL 100 MG PO TABS
100.0000 mg | ORAL_TABLET | Freq: Every day | ORAL | Status: DC
  Administered 2020-05-20 – 2020-05-22 (×3): 100 mg via ORAL
  Filled 2020-05-19 (×5): qty 1

## 2020-05-19 MED ORDER — CLONIDINE HCL 0.1 MG PO TABS
0.1000 mg | ORAL_TABLET | Freq: Four times a day (QID) | ORAL | Status: AC
  Administered 2020-05-19 – 2020-05-21 (×7): 0.1 mg via ORAL
  Filled 2020-05-19 (×10): qty 1

## 2020-05-19 MED ORDER — LORAZEPAM 1 MG PO TABS: 0.0000 mg | ORAL_TABLET | Freq: Two times a day (BID) | ORAL | Status: DC

## 2020-05-19 MED ORDER — NAPROXEN 500 MG PO TABS
500.0000 mg | ORAL_TABLET | Freq: Two times a day (BID) | ORAL | Status: DC | PRN
  Administered 2020-05-19 – 2020-05-20 (×2): 500 mg via ORAL
  Filled 2020-05-19 (×2): qty 1

## 2020-05-19 MED ORDER — DICYCLOMINE HCL 20 MG PO TABS
20.0000 mg | ORAL_TABLET | Freq: Four times a day (QID) | ORAL | Status: DC | PRN
  Administered 2020-05-19: 20 mg via ORAL
  Filled 2020-05-19: qty 1

## 2020-05-19 MED ORDER — LORAZEPAM 1 MG PO TABS: 0.0000 mg | ORAL_TABLET | Freq: Four times a day (QID) | ORAL | Status: DC

## 2020-05-19 MED ORDER — MAGNESIUM HYDROXIDE 400 MG/5ML PO SUSP: 30.0000 mL | Freq: Every day | ORAL | Status: DC | PRN

## 2020-05-19 MED ORDER — LORAZEPAM 2 MG/ML IJ SOLN: 0.0000 mg | Freq: Two times a day (BID) | INTRAMUSCULAR | Status: DC

## 2020-05-19 MED ORDER — NICOTINE 21 MG/24HR TD PT24
21.0000 mg | MEDICATED_PATCH | Freq: Every day | TRANSDERMAL | Status: DC
  Administered 2020-05-20 – 2020-05-22 (×3): 21 mg via TRANSDERMAL
  Filled 2020-05-19 (×6): qty 1

## 2020-05-19 MED ORDER — LORAZEPAM 1 MG PO TABS
1.0000 mg | ORAL_TABLET | Freq: Every day | ORAL | Status: DC
Start: 2020-05-23 — End: 2020-05-19

## 2020-05-19 MED ORDER — LOPERAMIDE HCL 2 MG PO CAPS
2.0000 mg | ORAL_CAPSULE | ORAL | Status: DC | PRN
Start: 2020-05-19 — End: 2020-05-19

## 2020-05-19 MED ORDER — LORAZEPAM 1 MG PO TABS
1.0000 mg | ORAL_TABLET | Freq: Four times a day (QID) | ORAL | Status: AC | PRN
  Administered 2020-05-19: 1 mg via ORAL
  Filled 2020-05-19: qty 1

## 2020-05-19 MED ORDER — LORAZEPAM 1 MG PO TABS: 1.0000 mg | ORAL_TABLET | Freq: Three times a day (TID) | ORAL | Status: DC

## 2020-05-19 MED ORDER — THIAMINE HCL 100 MG/ML IJ SOLN: 100.0000 mg | Freq: Every day | INTRAMUSCULAR | Status: DC

## 2020-05-19 MED ORDER — LORAZEPAM 1 MG PO TABS: 1.0000 mg | ORAL_TABLET | Freq: Four times a day (QID) | ORAL | Status: DC

## 2020-05-19 MED ORDER — ALUM & MAG HYDROXIDE-SIMETH 200-200-20 MG/5ML PO SUSP: 30.0000 mL | ORAL | Status: DC | PRN

## 2020-05-19 MED ORDER — THIAMINE HCL 100 MG PO TABS
100.0000 mg | ORAL_TABLET | Freq: Every day | ORAL | Status: DC
  Administered 2020-05-19: 100 mg via ORAL
  Filled 2020-05-19: qty 1

## 2020-05-19 MED ORDER — HYDROXYZINE HCL 25 MG PO TABS
25.0000 mg | ORAL_TABLET | Freq: Four times a day (QID) | ORAL | Status: DC | PRN
  Administered 2020-05-20 – 2020-05-21 (×3): 25 mg via ORAL
  Filled 2020-05-19 (×3): qty 1

## 2020-05-19 MED ORDER — ADULT MULTIVITAMIN W/MINERALS CH
1.0000 | ORAL_TABLET | Freq: Every day | ORAL | Status: DC
  Administered 2020-05-19 – 2020-05-22 (×5): 1 via ORAL
  Filled 2020-05-19 (×6): qty 1

## 2020-05-19 MED ORDER — LORAZEPAM 1 MG PO TABS
1.0000 mg | ORAL_TABLET | Freq: Two times a day (BID) | ORAL | Status: DC
Start: 2020-05-21 — End: 2020-05-19

## 2020-05-19 MED ORDER — METHOCARBAMOL 500 MG PO TABS
500.0000 mg | ORAL_TABLET | Freq: Three times a day (TID) | ORAL | Status: DC | PRN
  Filled 2020-05-19: qty 1

## 2020-05-19 MED ORDER — LOPERAMIDE HCL 2 MG PO CAPS: 2.0000 mg | ORAL_CAPSULE | ORAL | Status: DC | PRN

## 2020-05-19 MED ORDER — HYDROXYZINE HCL 25 MG PO TABS
25.0000 mg | ORAL_TABLET | Freq: Four times a day (QID) | ORAL | Status: DC | PRN
Start: 2020-05-19 — End: 2020-05-19

## 2020-05-19 MED ORDER — POLYETHYLENE GLYCOL 3350 17 G PO PACK
17.0000 g | PACK | Freq: Every day | ORAL | Status: DC
  Administered 2020-05-19 – 2020-05-21 (×3): 17 g via ORAL
  Filled 2020-05-19 (×7): qty 1

## 2020-05-19 MED ORDER — ACETAMINOPHEN 325 MG PO TABS
650.0000 mg | ORAL_TABLET | Freq: Four times a day (QID) | ORAL | Status: DC | PRN
  Administered 2020-05-20: 650 mg via ORAL
  Filled 2020-05-19: qty 2

## 2020-05-19 MED ORDER — TRAZODONE HCL 50 MG PO TABS
50.0000 mg | ORAL_TABLET | Freq: Every evening | ORAL | Status: DC | PRN
  Administered 2020-05-19 – 2020-05-20 (×2): 50 mg via ORAL
  Filled 2020-05-19 (×2): qty 1

## 2020-05-19 MED ORDER — LORAZEPAM 2 MG/ML IJ SOLN: 0.0000 mg | Freq: Four times a day (QID) | INTRAMUSCULAR | Status: DC

## 2020-05-19 MED ORDER — NICOTINE 21 MG/24HR TD PT24
21.0000 mg | MEDICATED_PATCH | Freq: Once | TRANSDERMAL | Status: DC
  Administered 2020-05-19: 21 mg via TRANSDERMAL
  Filled 2020-05-19: qty 1

## 2020-05-19 MED ORDER — CLONIDINE HCL 0.1 MG PO TABS
0.1000 mg | ORAL_TABLET | Freq: Every day | ORAL | Status: DC
  Filled 2020-05-19: qty 1

## 2020-05-19 MED ORDER — RISPERIDONE 1 MG PO TBDP: 1.0000 mg | ORAL_TABLET | Freq: Three times a day (TID) | ORAL | Status: DC | PRN

## 2020-05-19 MED ORDER — CLONIDINE HCL 0.1 MG PO TABS
0.1000 mg | ORAL_TABLET | ORAL | Status: DC
  Administered 2020-05-21 – 2020-05-22 (×2): 0.1 mg via ORAL
  Filled 2020-05-19 (×4): qty 1

## 2020-05-19 NOTE — Tx Team (Signed)
Initial Treatment Plan 05/19/2020 2:21 PM Dustin Miller RFX:[id number]    PATIENT STRESSORS: Marital or family conflict Substance abuse Traumatic event   PATIENT STRENGTHS: Ability for insight Capable of independent living   PATIENT IDENTIFIED PROBLEMS: "Substance abuse"  "Depression"                    DISCHARGE CRITERIA:  Ability to meet basic life and health needs Adequate post-discharge living arrangements Improved stabilization in mood, thinking, and/or behavior  PRELIMINARY DISCHARGE PLAN: Attend aftercare/continuing care group Attend PHP/IOP  PATIENT/FAMILY INVOLVEMENT: This treatment plan has been presented to and reviewed with the patient, Dustin Miller, and/or family member.  The patient and family have been given the opportunity to ask questions and make suggestions.  Layla Barter, RN 05/19/2020, 2:21 PM

## 2020-05-19 NOTE — ED Notes (Signed)
Pt to be transported to [facility] with [employer], pt calm and cooperative as he walked to car.

## 2020-05-19 NOTE — [facility] Suicide Risk Assessment (Signed)
[facility] Admission Suicide Risk Assessment   Nursing information obtained from:    Demographic factors:    Current Mental Status:    Loss Factors:    Historical Factors:    Risk Reduction Factors:     Total Time spent with patient: 30 minutes Principal Problem: <principal problem not specified> Diagnosis:  Active Problems:   Severe major depression, single episode (HCC)  Subjective Data: Patient is seen and examined.  Patient is a 24 year old male who originally presented to the [facility] emergency department on 05/18/2020 requesting "detox from everything".  Patient stated that recently has been unable to control his emotions.  He recently broke up with a girlfriend and apparently tore up the house punching holes in walls.  He stated he does not feel well with his anger, and has had passive suicidal ideation.  He is very tearful during the interview.  Prior to college he denied any problems with depression, anxiety or substance abuse.  He stated when he went to college he began drinking more heavily.  In 2016 he began drinking every day, then progressed to using ecstasy and opiates as well.  He stated most recently he drank alcohol, use cocaine, used opiates and ecstasy.  His last drink was approximately 24 hours prior to when I saw the patient.  The patient admitted to DUIs and having been on probation for that.  He apparently came off probation in March or May 2021.  He stated he had a significant family history for substance abuse and "everybody".  He also stated that his brother had a history of bipolar disorder.  He was admitted to the hospital for evaluation and stabilization.  He apparently has spoken to the folks at [facility], and has a bed at the rehabilitation facility once he is detox.  Continued Clinical Symptoms:  Alcohol Use Disorder Identification Test Final Score (AUDIT): 29 The "Alcohol Use Disorders Identification Test", Guidelines for Use in Primary Care, Second  Edition.  World Science writer [facility]). Score between 0-7:  no or low risk or alcohol related problems. Score between 8-15:  moderate risk of alcohol related problems. Score between 16-19:  high risk of alcohol related problems. Score 20 or above:  warrants further diagnostic evaluation for alcohol dependence and treatment.   CLINICAL FACTORS:   Depression:   Aggression Anhedonia Comorbid alcohol abuse/dependence Hopelessness Impulsivity Insomnia Alcohol/Substance Abuse/Dependencies   Musculoskeletal: Strength & Muscle Tone: within normal limits Gait & Station: normal Patient leans: N/A  Psychiatric Specialty Exam: Physical Exam Vitals and nursing note reviewed.  Constitutional:      Appearance: Normal appearance.  HENT:     Head: Normocephalic and atraumatic.  Pulmonary:     Effort: Pulmonary effort is normal.  Neurological:     General: No focal deficit present.     Mental Status: He is alert and oriented to person, place, and time.     Review of Systems  There were no vitals taken for this visit.There is no height or weight on file to calculate BMI.  General Appearance: Disheveled  Eye Contact:  Fair  Speech:  Normal Rate  Volume:  Increased  Mood:  Anxious, Depressed and Dysphoric  Affect:  Labile  Thought Process:  Coherent and Descriptions of Associations: Intact  Orientation:  Full (Time, Place, and Person)  Thought Content:  Logical  Suicidal Thoughts:  Yes.  without intent/plan  Homicidal Thoughts:  No  Memory:  Immediate;   Fair Recent;   Fair Remote;  Fair  Judgement:  Intact  Insight:  Fair  Psychomotor Activity:  Increased  Concentration:  Concentration: Fair and Attention Span: Fair  Recall:  [employer] of Knowledge:  Fair  Language:  Fair  Akathisia:  Negative  Handed:  Right  AIMS (if indicated):     Assets:  Desire for Improvement Resilience Social Support  ADL's:  Intact  Cognition:  WNL  Sleep:         COGNITIVE FEATURES  THAT CONTRIBUTE TO RISK:  None    SUICIDE RISK:   Mild:  Suicidal ideation of limited frequency, intensity, duration, and specificity.  There are no identifiable plans, no associated intent, mild dysphoria and related symptoms, good self-control (both objective and subjective assessment), few other risk factors, and identifiable protective factors, including available and accessible social support.  PLAN OF CARE:  Patient is seen and examined.  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  He will be admitted to the hospital.  He will be integrated in the milieu.  He will be encouraged to attend groups.  He will be placed on lorazepam 1 mg p.o. every 6 hours as needed a CIWA greater than 10.  He will also be placed on the opiate detox protocol including the Catapres.  We will also put on board Risperdal 1 mg p.o. 3 times daily as needed agitation given his lability.  Review of his laboratories revealed mildly elevated AST at 45 and ALT at 57.  His CBC was normal including his MCV.  Blood alcohol in the emergency department was 44.  His drug screen was positive for amphetamines, opiates and marijuana.  He had previously admitted to cocaine but had denied marijuana.  His EKG showed a sinus tachycardia with a QTc interval of 437.  PMP database revealed no prescriptions for controlled substances.  I certify that inpatient services furnished can reasonably be expected to improve the patient's condition.   Antonieta Pert, MD 05/19/2020, 2:32 PM

## 2020-05-19 NOTE — ED Notes (Signed)
Report given to Patrice RN at [facility].  
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
## 2020-05-19 NOTE — ED Notes (Signed)
Safe transport has been called. They are over an hour away. He will call when he is closer to facility for transport. CN made aware of delay.
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
## 2020-05-19 NOTE — Progress Notes (Signed)
Patient signed a 72 hour request for discharge form on 05/19/20 at 1645. Document reviewed with the pt and the pt verbalizes understanding.

## 2020-05-19 NOTE — Progress Notes (Signed)
   05/19/20 2226  COVID-19 Daily Checkoff  Have you had a fever (temp > 37.80C/100F)  in the past 24 hours?  No  If you have had runny nose, nasal congestion, sneezing in the past 24 hours, has it worsened? No  COVID-19 EXPOSURE  Have you traveled outside the state in the past 14 days? No  Have you been in contact with someone with a confirmed diagnosis of COVID-19 or PUI in the past 14 days without wearing appropriate PPE? No  Have you been living in the same home as a person with confirmed diagnosis of COVID-19 or a PUI (household contact)? No  Have you been diagnosed with COVID-19? No

## 2020-05-19 NOTE — Progress Notes (Signed)
   05/19/20 2228  Psych Admission Type (Psych Patients Only)  Admission Status Voluntary  Psychosocial Assessment  Patient Complaints Substance abuse  Eye Contact Fair  Facial Expression Flat  Affect Appropriate to circumstance  Speech Logical/coherent  Interaction Assertive  Motor Activity Other (Comment) (WDL)  Appearance/Hygiene Unremarkable  Behavior Characteristics Appropriate to situation  Mood Pleasant  Thought Process  Coherency WDL  Content WDL  Delusions None reported or observed  Perception WDL  Hallucination None reported or observed  Judgment Poor  Confusion None  Danger to Self  Current suicidal ideation? Denies  Danger to Others  Danger to Others None reported or observed

## 2020-05-19 NOTE — ED Notes (Signed)
Attempted report, no nurse available, left hospital information for someone to call back to receive report.

## 2020-05-20 DIAGNOSIS — F322 Major depressive disorder, single episode, severe without psychotic features: Principal | ICD-10-CM

## 2020-05-20 LAB — LIPID PANEL
Cholesterol: 199 mg/dL (ref 0–200)
HDL: 99 mg/dL (ref >40)
LDL Cholesterol: 77 mg/dL (ref 0–99)
Total CHOL/HDL Ratio: 2 RATIO
Triglycerides: 113 mg/dL (ref <150)
VLDL: 23 mg/dL (ref 0–40)

## 2020-05-20 LAB — HEMOGLOBIN A1C
Hgb A1c MFr Bld: 5.2 % (ref 4.8–5.6)
Mean Plasma Glucose: 102.54 mg/dL

## 2020-05-20 LAB — TSH: TSH: 1.068 u[IU]/mL (ref 0.350–4.500)

## 2020-05-20 MED ORDER — BISACODYL 5 MG PO TBEC: 5.0000 mg | DELAYED_RELEASE_TABLET | Freq: Every day | ORAL | Status: DC | PRN

## 2020-05-20 NOTE — Progress Notes (Signed)
   05/20/20 2200  Psych Admission Type (Psych Patients Only)  Admission Status Voluntary  Psychosocial Assessment  Patient Complaints Anxiety  Eye Contact Fair  Facial Expression Flat  Affect Appropriate to circumstance  Speech Logical/coherent  Interaction Assertive  Motor Activity Other (Comment) (WDL)  Appearance/Hygiene Unremarkable  Behavior Characteristics Anxious  Mood Depressed  Thought Process  Coherency WDL  Content WDL  Delusions None reported or observed  Perception WDL  Hallucination None reported or observed  Judgment Poor  Confusion None  Danger to Self  Current suicidal ideation? Denies  Danger to Others  Danger to Others None reported or observed

## 2020-05-20 NOTE — Plan of Care (Signed)
Nurse discussed anxiety, depression and coping skills with patient.  

## 2020-05-20 NOTE — Progress Notes (Signed)
Pt did not attend the evening wrap up group. Pt was asleep during group time.

## 2020-05-20 NOTE — Tx Team (Cosign Needed)
Interdisciplinary Treatment and Diagnostic Plan Update  05/20/2020 Time of Session: 10:00am Dustin Miller MRN: [id number]  Principal Diagnosis: <principal problem not specified>  Secondary Diagnoses: Active Problems:   Severe major depression, single episode (HCC)   Current Medications:  Current Facility-Administered Medications  Medication Dose Route Frequency Provider Last Rate Last Admin  . acetaminophen (TYLENOL) tablet 650 mg  650 mg Oral Q6H PRN Lindon Romp A, NP   650 mg at 05/20/20 0738  . alum & mag hydroxide-simeth (MAALOX/MYLANTA) 200-200-20 MG/5ML suspension 30 mL  30 mL Oral Q4H PRN Lindon Romp A, NP      . cloNIDine (CATAPRES) tablet 0.1 mg  0.1 mg Oral QID Sharma Covert, MD   0.1 mg at 05/20/20 0865   Followed by  . [START ON 05/21/2020] cloNIDine (CATAPRES) tablet 0.1 mg  0.1 mg Oral BH-qamhs Clary, Cordie Grice, MD       Followed by  . [START ON 05/24/2020] cloNIDine (CATAPRES) tablet 0.1 mg  0.1 mg Oral QAC breakfast Sharma Covert, MD      . dicyclomine (BENTYL) tablet 20 mg  20 mg Oral Q6H PRN Sharma Covert, MD   20 mg at 05/19/20 2037  . hydrOXYzine (ATARAX/VISTARIL) tablet 25 mg  25 mg Oral Q6H PRN Sharma Covert, MD   25 mg at 05/20/20 0736  . loperamide (IMODIUM) capsule 2-4 mg  2-4 mg Oral PRN Sharma Covert, MD      . LORazepam (ATIVAN) tablet 1 mg  1 mg Oral Q6H PRN Lindon Romp A, NP   1 mg at 05/19/20 2037  . magnesium hydroxide (MILK OF MAGNESIA) suspension 30 mL  30 mL Oral Daily PRN Lindon Romp A, NP      . methocarbamol (ROBAXIN) tablet 500 mg  500 mg Oral Q8H PRN Sharma Covert, MD      . multivitamin with minerals tablet 1 tablet  1 tablet Oral Daily Lindon Romp A, NP   1 tablet at 05/20/20 7846  . naproxen (NAPROSYN) tablet 500 mg  500 mg Oral BID PRN Sharma Covert, MD   500 mg at 05/19/20 2033  . nicotine (NICODERM CQ - dosed in mg/24 hours) patch 21 mg  21 mg Transdermal Daily Lindell Spar I, NP   21 mg at 05/20/20 0657   . ondansetron (ZOFRAN-ODT) disintegrating tablet 4 mg  4 mg Oral Q6H PRN Sharma Covert, MD      . polyethylene glycol (MIRALAX / GLYCOLAX) packet 17 g  17 g Oral Daily Sharma Covert, MD   17 g at 05/20/20 9629  . risperiDONE (RISPERDAL M-TABS) disintegrating tablet 1 mg  1 mg Oral Q8H PRN Sharma Covert, MD      . thiamine tablet 100 mg  100 mg Oral Daily Lindon Romp A, NP   100 mg at 05/20/20 0729  . traZODone (DESYREL) tablet 50 mg  50 mg Oral QHS PRN Rozetta Nunnery, NP   50 mg at 05/19/20 2037   PTA Medications: Medications Prior to Admission  Medication Sig Dispense Refill Last Dose  . cetirizine (ZYRTEC ALLERGY) 10 MG tablet Take 1 tablet (10 mg total) by mouth daily. (Patient not taking: Reported on 05/18/2020) 30 tablet 1   . fluticasone (FLONASE) 50 MCG/ACT nasal spray Place 2 sprays into both nostrils daily. (Patient not taking: Reported on 05/18/2020) 16 g 0   . montelukast (SINGULAIR) 10 MG tablet Take 1 tablet (10 mg total) by mouth at bedtime. (Patient  not taking: Reported on 05/18/2020) 30 tablet 0     Patient Stressors: Marital or family conflict Substance abuse Traumatic event  Patient Strengths: Ability for insight Capable of independent living  Treatment Modalities: Medication Management, Group therapy, Case management,  1 to 1 session with clinician, Psychoeducation, Recreational therapy.   Physician Treatment Plan for Primary Diagnosis: <principal problem not specified> Long Term Goal(s): Improvement in symptoms so as ready for discharge Improvement in symptoms so as ready for discharge   Short Term Goals: Ability to identify changes in lifestyle to reduce recurrence of condition will improve Ability to verbalize feelings will improve Ability to disclose and discuss suicidal ideas Ability to demonstrate self-control will improve Ability to identify and develop effective coping behaviors will improve Ability to maintain clinical measurements within  normal limits will improve Ability to identify triggers associated with substance abuse/mental health issues will improve Ability to identify changes in lifestyle to reduce recurrence of condition will improve Ability to verbalize feelings will improve Ability to disclose and discuss suicidal ideas Ability to demonstrate self-control will improve Ability to identify and develop effective coping behaviors will improve Ability to maintain clinical measurements within normal limits will improve Ability to identify triggers associated with substance abuse/mental health issues will improve  Medication Management: Evaluate patient's response, side effects, and tolerance of medication regimen.  Therapeutic Interventions: 1 to 1 sessions, Unit Group sessions and Medication administration.  Evaluation of Outcomes: Not Met  Physician Treatment Plan for Secondary Diagnosis: Active Problems:   Severe major depression, single episode ([place])  Long Term Goal(s): Improvement in symptoms so as ready for discharge Improvement in symptoms so as ready for discharge   Short Term Goals: Ability to identify changes in lifestyle to reduce recurrence of condition will improve Ability to verbalize feelings will improve Ability to disclose and discuss suicidal ideas Ability to demonstrate self-control will improve Ability to identify and develop effective coping behaviors will improve Ability to maintain clinical measurements within normal limits will improve Ability to identify triggers associated with substance abuse/mental health issues will improve Ability to identify changes in lifestyle to reduce recurrence of condition will improve Ability to verbalize feelings will improve Ability to disclose and discuss suicidal ideas Ability to demonstrate self-control will improve Ability to identify and develop effective coping behaviors will improve Ability to maintain clinical measurements within normal limits  will improve Ability to identify triggers associated with substance abuse/mental health issues will improve     Medication Management: Evaluate patient's response, side effects, and tolerance of medication regimen.  Therapeutic Interventions: 1 to 1 sessions, Unit Group sessions and Medication administration.  Evaluation of Outcomes: Not Met   RN Treatment Plan for Primary Diagnosis: <principal problem not specified> Long Term Goal(s): Knowledge of disease and therapeutic regimen to maintain health will improve  Short Term Goals: Ability to participate in decision making will improve, Ability to verbalize feelings will improve, Ability to disclose and discuss suicidal ideas, Ability to identify and develop effective coping behaviors will improve and Compliance with prescribed medications will improve  Medication Management: RN will administer medications as ordered by provider, will assess and evaluate patient's response and provide education to patient for prescribed medication. RN will report any adverse and/or side effects to prescribing provider.  Therapeutic Interventions: 1 on 1 counseling sessions, Psychoeducation, Medication administration, Evaluate responses to treatment, Monitor vital signs and CBGs as ordered, Perform/monitor CIWA, COWS, AIMS and Fall Risk screenings as ordered, Perform wound care treatments as ordered.  Evaluation of Outcomes:  Not Met   LCSW Treatment Plan for Primary Diagnosis: <principal problem not specified> Long Term Goal(s): Safe transition to appropriate next level of care at discharge, Engage patient in therapeutic group addressing interpersonal concerns.  Short Term Goals: Engage patient in aftercare planning with referrals and resources  Therapeutic Interventions: Assess for all discharge needs, 1 to 1 time with Social worker, Explore available resources and support systems, Assess for adequacy in community support network, Educate family and  significant other(s) on suicide prevention, Complete Psychosocial Assessment, Interpersonal group therapy.  Evaluation of Outcomes: Not Met   Progress in Treatment: Attending groups: No. Participating in groups: No. Taking medication as prescribed: Yes. Toleration medication: Yes. Family/Significant other contact made: No, will contact:  when pt gives consent Patient understands diagnosis: Yes. Discussing patient identified problems/goals with staff: Yes. Medical problems stabilized or resolved: Yes. Denies suicidal/homicidal ideation: Yes. Issues/concerns per patient self-inventory: No. Other: NA  New problem(s) identified: No, Describe:  None reported  New Short Term/Long Term Goal(s):Attend outpatient treatment, take medication as prescribed, develop and implement healthy coping methods  Patient Goals:  "Get sober and be able to handle my emotions in a healthy way."  Discharge Plan or Barriers: Pt request referral to [facility] recovery services for substance abuse inpatient residential treatment.   Reason for Continuation of Hospitalization: Medication stabilization Suicidal ideation Withdrawal symptoms  Estimated Length of Stay:1-7 days  Attendees: Patient:Dustin Miller 05/20/2020 10:40 AM  Physician:  05/20/2020 10:40 AM  Nursing: Harriett Sine, NP 05/20/2020 10:40 AM  RN Care Manager: 05/20/2020 10:40 AM  Social Worker: Sanjuana Kava 05/20/2020 10:40 AM  Recreational Therapist:  05/20/2020 10:40 AM  Other:  05/20/2020 10:40 AM  Other:  05/20/2020 10:40 AM  Other: 05/20/2020 10:40 AM    Scribe for Treatment Team: Yvette Rack, LCSW 05/20/2020 10:40 AM

## 2020-05-20 NOTE — [facility] Group Notes (Signed)
Adult Psychoeducational Group Note  Date:  05/20/2020 Time:  9:27 PM  Group Topic/Focus:  Wrap-Up Group:   The focus of this group is to help patients review their daily goal of treatment and discuss progress on daily workbooks.  Participation Level:  Active  Participation Quality:  Appropriate and Attentive  Affect:  Appropriate  Cognitive:  Alert and Appropriate  Insight: Appropriate and Good  Engagement in Group:  Engaged  Modes of Intervention:  Discussion and Education  Additional Comments:  Pt attended and participated in wrap up group this evening and rated their day a 6/10, due to them just having and okay day. Pt is working on a goal, which is to stay positive and push away negative thoughts.   Chrisandra Netters 05/20/2020, 9:27 PM
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
## 2020-05-20 NOTE — Progress Notes (Signed)
D:  Patient denied SI and HI, contracts for safety.  Denied auditory hallucinations.  Stated he sees  moving shadows occasionally, but not today.  A:  Medications administered per MD orders  Emotional support and encouragement given patient. R:  Safety maintained with 15 minute checks.

## 2020-05-20 NOTE — Progress Notes (Signed)
[facility] MD Progress Note  05/20/2020 12:17 PM Dustin Miller  MRN:  [id number]  Patient is a 24 year old male who originally presented to the [facility] emergency department on 05/18/2020 requesting "detox from everything". Patient stated that recently has been unable to control his emotions. He recently broke up with a girlfriend and apparently tore up the house punching holes in walls. He stated most recently he drank alcohol, use cocaine, used opiates and ecstasy.  On assessment today, Dustin Miller found sitting in the dayroom. He states, "I'm better now that I slept." He states he had not been sleeping recently related to drug binge. He reports stable mood. He states he had been using Percocets, oxycodone, ETOH, morphine, cocaine, and ecstasy prior to admission. He denies withdrawal symptoms and is showing no signs of withdrawal. He denies SI/HI/AVH. He denies prior rehab and states he wants to stop using since his fiance left him. He states he was told by [facility] that he has a bed there for rehab after he is detoxed.  Principal Problem: <principal problem not specified> Diagnosis: Active Problems:   Severe major depression, single episode (HCC)  Total Time spent with patient: 15 minutes  Past Psychiatric History: See admission H&P  Past Medical History:  Past Medical History:  Diagnosis Date  . Migraine     Past Surgical History:  Procedure Laterality Date  . lymph nodes remove    . pyloric stenosis repair    . TONSILLECTOMY    . tubes in the ear     Family History:  Family History  Problem Relation Age of Onset  . Heart disease Mother    Family Psychiatric  History: See admission H&P Social History:  Social History   Substance and Sexual Activity  Alcohol Use No     Social History   Substance and Sexual Activity  Drug Use Yes  . Types: Cocaine, Marijuana, Methamphetamines   Comment: Pills, Ectasy, Molly, Percs, Bars, "White Girl"    Social History    Socioeconomic History  . Marital status: Single    Spouse name: Not on file  . Number of children: Not on file  . Years of education: Not on file  . Highest education level: Not on file  Occupational History  . Not on file  Tobacco Use  . Smoking status: Never Smoker  . Smokeless tobacco: Never Used  Substance and Sexual Activity  . Alcohol use: No  . Drug use: Yes    Types: Cocaine, Marijuana, Methamphetamines    Comment: Pills, Ectasy, Molly, Percs, Bars, "White Girl"  . Sexual activity: Not on file  Other Topics Concern  . Not on file  Social History Narrative  . Not on file   Social Determinants of Health   Financial Resource Strain:   . Difficulty of Paying Living Expenses:   Food Insecurity:   . Worried About Programme researcher, broadcasting/film/video in the Last Year:   . Barista in the Last Year:   Transportation Needs:   . Freight forwarder (Medical):   Marland Kitchen Lack of Transportation (Non-Medical):   Physical Activity:   . Days of Exercise per Week:   . Minutes of Exercise per Session:   Stress:   . Feeling of Stress :   Social Connections:   . Frequency of Communication with Friends and Family:   . Frequency of Social Gatherings with Friends and Family:   . Attends Religious Services:   . Active Member of Clubs  or Organizations:   . Attends Banker Meetings:   Marland Kitchen Marital Status:    Additional Social History:                         Sleep: Good  Appetite:  Good  Current Medications: Current Facility-Administered Medications  Medication Dose Route Frequency Provider Last Rate Last Admin  . acetaminophen (TYLENOL) tablet 650 mg  650 mg Oral Q6H PRN Nira Conn A, NP   650 mg at 05/20/20 0738  . alum & mag hydroxide-simeth (MAALOX/MYLANTA) 200-200-20 MG/5ML suspension 30 mL  30 mL Oral Q4H PRN Nira Conn A, NP      . bisacodyl (DULCOLAX) EC tablet 5 mg  5 mg Oral Daily PRN Aldean Baker, NP      . cloNIDine (CATAPRES) tablet 0.1 mg  0.1 mg  Oral QID Antonieta Pert, MD   0.1 mg at 05/20/20 1205   Followed by  . [START ON 05/21/2020] cloNIDine (CATAPRES) tablet 0.1 mg  0.1 mg Oral BH-qamhs Clary, Marlane Mingle, MD       Followed by  . [START ON 05/24/2020] cloNIDine (CATAPRES) tablet 0.1 mg  0.1 mg Oral QAC breakfast Antonieta Pert, MD      . dicyclomine (BENTYL) tablet 20 mg  20 mg Oral Q6H PRN Antonieta Pert, MD   20 mg at 05/19/20 2037  . hydrOXYzine (ATARAX/VISTARIL) tablet 25 mg  25 mg Oral Q6H PRN Antonieta Pert, MD   25 mg at 05/20/20 0736  . loperamide (IMODIUM) capsule 2-4 mg  2-4 mg Oral PRN Antonieta Pert, MD      . LORazepam (ATIVAN) tablet 1 mg  1 mg Oral Q6H PRN Nira Conn A, NP   1 mg at 05/19/20 2037  . magnesium hydroxide (MILK OF MAGNESIA) suspension 30 mL  30 mL Oral Daily PRN Nira Conn A, NP      . methocarbamol (ROBAXIN) tablet 500 mg  500 mg Oral Q8H PRN Antonieta Pert, MD      . multivitamin with minerals tablet 1 tablet  1 tablet Oral Daily Nira Conn A, NP   1 tablet at 05/20/20 0729  . naproxen (NAPROSYN) tablet 500 mg  500 mg Oral BID PRN Antonieta Pert, MD   500 mg at 05/20/20 1204  . nicotine (NICODERM CQ - dosed in mg/24 hours) patch 21 mg  21 mg Transdermal Daily Armandina Stammer I, NP   21 mg at 05/20/20 0657  . ondansetron (ZOFRAN-ODT) disintegrating tablet 4 mg  4 mg Oral Q6H PRN Antonieta Pert, MD      . polyethylene glycol (MIRALAX / GLYCOLAX) packet 17 g  17 g Oral Daily Antonieta Pert, MD   17 g at 05/20/20 1062  . risperiDONE (RISPERDAL M-TABS) disintegrating tablet 1 mg  1 mg Oral Q8H PRN Antonieta Pert, MD      . thiamine tablet 100 mg  100 mg Oral Daily Nira Conn A, NP   100 mg at 05/20/20 0729  . traZODone (DESYREL) tablet 50 mg  50 mg Oral QHS PRN Jackelyn Poling, NP   50 mg at 05/19/20 2037    Lab Results:  Results for orders placed or performed during the hospital encounter of 05/19/20 (from the past 48 hour(s))  Hemoglobin A1c     Status: None    Collection Time: 05/20/20  6:54 AM  Result Value Ref Range   Hgb A1c MFr  Bld 5.2 4.8 - 5.6 %    Comment: (NOTE) Pre diabetes:          5.7%-6.4%  Diabetes:              >6.4%  Glycemic control for   <7.0% adults with diabetes    Mean Plasma Glucose 102.54 mg/dL    Comment: Performed at [facility], [address] [place], [place] [address]  Lipid panel     Status: None   Collection Time: 05/20/20  6:54 AM  Result Value Ref Range   Cholesterol 199 0 - 200 mg/dL   Triglycerides 604113 <540<150 mg/dL   HDL 99 >98>40 mg/dL   Total CHOL/HDL Ratio 2.0 RATIO   VLDL 23 0 - 40 mg/dL   LDL Cholesterol 77 0 - 99 mg/dL    Comment:        Total Cholesterol/HDL:CHD Risk Coronary Heart Disease Risk Table                     Men   Women  1/2 Average Risk   3.4   3.3  Average Risk       5.0   4.4  2 X Average Risk   9.6   7.1  3 X Average Risk  23.4   11.0        Use the calculated Patient Ratio above and the CHD Risk Table to determine the patient's CHD Risk.        ATP III CLASSIFICATION (LDL):  <100     mg/dL   Optimal  [id number]  mg/dL   Near or Above                    Optimal  130-159  mg/dL   Borderline  [id number]  mg/dL   High  >130>190     mg/dL   Very High Performed at [facility], [address] [place], [place] [address]   TSH     Status: None   Collection Time: 05/20/20  6:54 AM  Result Value Ref Range   TSH 1.068 0.350 - 4.500 uIU/mL    Comment: Performed by a 3rd Generation assay with a functional sensitivity of <=0.01 uIU/mL. Performed at [facility], [address] [place], [place] [address]     Blood Alcohol level:  Lab Results  Component Value Date   ETH 44 (H) 05/18/2020    Metabolic Disorder Labs: Lab Results  Component Value Date   HGBA1C 5.2 05/20/2020   MPG 102.54 05/20/2020   No results found for: PROLACTIN Lab Results  Component Value Date   CHOL 199 05/20/2020   TRIG 113 05/20/2020   HDL 99 05/20/2020    CHOLHDL 2.0 05/20/2020   VLDL 23 05/20/2020   LDLCALC 77 05/20/2020    Physical Findings: AIMS: Facial and Oral Movements Muscles of Facial Expression: None, normal Lips and Perioral Area: None, normal Jaw: None, normal Tongue: None, normal,Extremity Movements Upper (arms, wrists, hands, fingers): None, normal Lower (legs, knees, ankles, toes): None, normal, Trunk Movements Neck, shoulders, hips: None, normal, Overall Severity Severity of abnormal movements (highest score from questions above): None, normal Incapacitation due to abnormal movements: None, normal Patient's awareness of abnormal movements (rate only patient's report): No Awareness, Dental Status Current problems with teeth and/or dentures?: No Does patient usually wear dentures?: No  CIWA:  CIWA-Ar Total: 0 COWS:  COWS Total Score: 2  Musculoskeletal: Strength & Muscle Tone: within  normal limits Gait & Station: normal Patient leans: N/A  Psychiatric Specialty Exam: Physical Exam Vitals and nursing note reviewed.  Constitutional:      Appearance: He is well-developed.  Cardiovascular:     Rate and Rhythm: Normal rate.  Pulmonary:     Effort: Pulmonary effort is normal.  Neurological:     Mental Status: He is alert and oriented to person, place, and time.     Review of Systems  Constitutional: Negative.   Respiratory: Negative for cough and shortness of breath.   Psychiatric/Behavioral: Negative for agitation, behavioral problems, confusion, dysphoric mood, hallucinations, self-injury, sleep disturbance and suicidal ideas. The patient is not nervous/anxious and is not hyperactive.     Blood pressure 101/66, pulse 84, temperature 98.2 F (36.8 C), temperature source Oral, resp. rate 18, height 5\' 9"  (1.753 m), weight 68 kg, SpO2 100 %.Body mass index is 22.15 kg/m.  General Appearance: Casual  Eye Contact:  Good  Speech:  Normal Rate  Volume:  Normal  Mood:  Euthymic  Affect:  Congruent  Thought  Process:  Coherent and Goal Directed  Orientation:  Full (Time, Place, and Person)  Thought Content:  Logical  Suicidal Thoughts:  No  Homicidal Thoughts:  No  Memory:  Immediate;   Fair Recent;   Fair  Judgement:  Intact  Insight:  Fair  Psychomotor Activity:  Normal  Concentration:  Concentration: Fair and Attention Span: Fair  Recall:  of Knowledge:  Fair  Language:  Good  Akathisia:  No  Handed:  Right  AIMS (if indicated):     Assets:  Communication Skills Desire for Improvement Resilience  ADL's:  Intact  Cognition:  WNL  Sleep:  Number of Hours: 5.5     Treatment Plan Summary: Daily contact with patient to assess and evaluate symptoms and progress in treatment and Medication management   Continue inpatient hospitalization.  Continue Clonidine COWS protocol for opioid withdrawal Continue CIWA protocol with Ativan PRN CIWA>10 for ETOH withdrawal Continue Risperdal 1 mg PO TID PRN agitation Continue trazodone 50 mg PO QHS PRN insomnia Continue Vistaril 25 mg PO Q6HR PRN anxiety  Patient will participate in the therapeutic group milieu.  Discharge disposition in progress.   [employer], NP 05/20/2020, 12:17 PM

## 2020-05-20 NOTE — Progress Notes (Signed)
Recreation Therapy Notes  Date:  7.5.21 Time: 0930 Location: [address] Dayroom  Group Topic: Stress Management  Goal Area(s) Addresses:  Patient will identify positive stress management techniques. Patient will identify benefits of using stress management post d/c.  Intervention: Stress Management  Activity: Meditation.  LRT played a meditation that focused on being resilient in the face of change.  Patients were to listen and follow along as meditation played in order to engage in activity.    Education:  Stress Management, Discharge Planning.   Education Outcome: Acknowledges Education  Clinical Observations/Feedback: Pt did not attend group activity.    Caroll Rancher, LRT/CTRS         Lillia Abed, Denyla Cortese A 05/20/2020 11:14 AM

## 2020-05-21 MED ORDER — MIRTAZAPINE 7.5 MG PO TABS
7.5000 mg | ORAL_TABLET | Freq: Every day | ORAL | Status: DC
  Administered 2020-05-21: 7.5 mg via ORAL
  Filled 2020-05-21 (×3): qty 1

## 2020-05-21 NOTE — Progress Notes (Signed)
Patient states that he read more today. His goal for tomorrow is to try and finish reading his book.

## 2020-05-21 NOTE — Progress Notes (Signed)
[facility] MD Progress Note  05/21/2020 3:00 PM Dustin Miller  MRN:  [id number]  Subjective : Patient reports overall improvement but describes lingering anxiety, depression.  Denies suicidal ideations and presents future oriented.  Currently he is not endorsing significant withdrawal/opiate withdrawal symptoms.  Objective: I discussed case with treatment team and met with patient. 24 year old male, history of substance use disorder, identifies opiates as substance of choice (was using opiate analgesics).  Patient UDS positive for amphetamines, opiates, cannabis, BAL 44.  He presented on 7/3 requesting detox, presenting depressed and endorsing passive SI.  Recent break-up with girlfriend after which he punched holes in the walls.  Today patient presents alert, attentive, polite/vaguely anxious on approach. Does not appear to be in any acute distress or discomfort and vitals are stable (120/79, pulse 76).  Endorses slight/mild residual symptoms of opiate withdrawal but acknowledges feeling better and currently presents comfortable and in no acute distress.  Tolerating p.o. intake well. Patient is denying SI and presents future oriented, and expressed interest in going to a rehab but as per CSW note [facility] not currently an option for patient.  He is also expressing interest in going to live with his mother, whom he states is supportive and will provide a sober/safe setting for him.  Plans to attend IOP. Patient is expressing interest in an antidepressant trial.  He endorses depression and anxiety symptoms which he feels may be to some extent chronic/independent from substance use. No disruptive or agitated behaviors on unit, some group participation. 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 Migraine     Past Surgical History:  Procedure Laterality Date  . lymph nodes remove    . pyloric stenosis repair    . TONSILLECTOMY    . tubes in the ear     Family History:  Family History  Problem Relation Age of Onset  . Heart disease Mother    Family Psychiatric  History: See admission H&P Social History:  Social History   Substance and Sexual Activity  Alcohol Use No     Social History   Substance and Sexual Activity  Drug Use Yes  . Types: Cocaine, Marijuana, Methamphetamines   Comment: Pills, Ectasy, Molly, Percs, Bars, "White Girl"    Social History   Socioeconomic History  . Marital status: Single    Spouse name: Not on file  . Number of children: Not on file  . Years of education: Not on file  . Highest education level: Not on file  Occupational History  . Not on file  Tobacco Use  . Smoking status: Never Smoker  . Smokeless tobacco: Never Used  Substance and Sexual Activity  . Alcohol use: No  . Drug use: Yes    Types: Cocaine, Marijuana, Methamphetamines    Comment: Pills, Ectasy, Molly, Percs, Bars, "White Girl"  . Sexual activity: Not on file  Other Topics Concern  . Not on file  Social History Narrative  . Not on file   Social Determinants of Health   Financial Resource Strain:   . Difficulty of Paying Living Expenses:   Food Insecurity:   . Worried About Charity fundraiser in the Last Year:   . Arboriculturist in the Last Year:   Transportation Needs:   .  Lack of Transportation (Medical):   Marland Kitchen Lack of Transportation (Non-Medical):   Physical Activity:   . Days of Exercise per Week:   . Minutes of Exercise per Session:   Stress:   . Feeling of Stress :   Social Connections:   . Frequency of Communication with Friends and Family:   . Frequency of Social Gatherings with Friends and Family:   . Attends Religious Services:   . Active Member of Clubs or Organizations:   . Attends Theatre manager Meetings:   Marland Kitchen Marital Status:    Additional Social History:   Sleep: Fair  Appetite:  Good  Current Medications: Current Facility-Administered Medications  Medication Dose Route Frequency Provider Last Rate Last Admin  . acetaminophen (TYLENOL) tablet 650 mg  650 mg Oral Q6H PRN Lindon Romp A, NP   650 mg at 05/20/20 0738  . alum & mag hydroxide-simeth (MAALOX/MYLANTA) 200-200-20 MG/5ML suspension 30 mL  30 mL Oral Q4H PRN Lindon Romp A, NP      . bisacodyl (DULCOLAX) EC tablet 5 mg  5 mg Oral Daily PRN Connye Burkitt, NP      . cloNIDine (CATAPRES) tablet 0.1 mg  0.1 mg Oral QID Sharma Covert, MD   0.1 mg at 05/21/20 1209   Followed by  . cloNIDine (CATAPRES) tablet 0.1 mg  0.1 mg Oral BH-qamhs Mallie Darting Cordie Grice, MD       Followed by  . [START ON 05/24/2020] cloNIDine (CATAPRES) tablet 0.1 mg  0.1 mg Oral QAC breakfast Sharma Covert, MD      . dicyclomine (BENTYL) tablet 20 mg  20 mg Oral Q6H PRN Sharma Covert, MD   20 mg at 05/19/20 2037  . hydrOXYzine (ATARAX/VISTARIL) tablet 25 mg  25 mg Oral Q6H PRN Sharma Covert, MD   25 mg at 05/20/20 2149  . loperamide (IMODIUM) capsule 2-4 mg  2-4 mg Oral PRN Sharma Covert, MD      . LORazepam (ATIVAN) tablet 1 mg  1 mg Oral Q6H PRN Lindon Romp A, NP   1 mg at 05/19/20 2037  . magnesium hydroxide (MILK OF MAGNESIA) suspension 30 mL  30 mL Oral Daily PRN Lindon Romp A, NP      . methocarbamol (ROBAXIN) tablet 500 mg  500 mg Oral Q8H PRN Sharma Covert, MD      . multivitamin with minerals tablet 1 tablet  1 tablet Oral Daily Lindon Romp A, NP   1 tablet at 05/21/20 0600  . naproxen (NAPROSYN) tablet 500 mg  500 mg Oral BID PRN Sharma Covert, MD   500 mg at 05/20/20 1204  . nicotine (NICODERM CQ - dosed in mg/24 hours) patch 21 mg  21 mg Transdermal Daily Lindell Spar I, NP   21 mg at 05/21/20 0753  . ondansetron (ZOFRAN-ODT) disintegrating tablet 4 mg  4 mg Oral Q6H PRN Sharma Covert, MD       . polyethylene glycol (MIRALAX / GLYCOLAX) packet 17 g  17 g Oral Daily Sharma Covert, MD   17 g at 05/21/20 0752  . risperiDONE (RISPERDAL M-TABS) disintegrating tablet 1 mg  1 mg Oral Q8H PRN Sharma Covert, MD      . thiamine tablet 100 mg  100 mg Oral Daily Lindon Romp A, NP   100 mg at 05/21/20 0752  . traZODone (DESYREL) tablet 50 mg  50 mg Oral QHS PRN Rozetta Nunnery, NP   50  mg at 05/20/20 2149    Lab Results:  Results for orders placed or performed during the hospital encounter of 05/19/20 (from the past 48 hour(s))  Hemoglobin A1c     Status: None   Collection Time: 05/20/20  6:54 AM  Result Value Ref Range   Hgb A1c MFr Bld 5.2 4.8 - 5.6 %    Comment: (NOTE) Pre diabetes:          5.7%-6.4%  Diabetes:              >6.4%  Glycemic control for   <7.0% adults with diabetes    Mean Plasma Glucose 102.54 mg/dL    Comment: Performed at [facility], [place] [address], [place], [place] [address]  Lipid panel     Status: None   Collection Time: 05/20/20  6:54 AM  Result Value Ref Range   Cholesterol 199 0 - 200 mg/dL   Triglycerides 113 <150 mg/dL   HDL 99 >40 mg/dL   Total CHOL/HDL Ratio 2.0 RATIO   VLDL 23 0 - 40 mg/dL   LDL Cholesterol 77 0 - 99 mg/dL    Comment:        Total Cholesterol/HDL:CHD Risk Coronary Heart Disease Risk Table                     Men   Women  1/2 Average Risk   3.4   3.3  Average Risk       5.0   4.4  2 X Average Risk   9.6   7.1  3 X Average Risk  23.4   11.0        Use the calculated Patient Ratio above and the CHD Risk Table to determine the patient's CHD Risk.        ATP III CLASSIFICATION (LDL):  <100     mg/dL   Optimal  100-129  mg/dL   Near or Above                    Optimal  130-159  mg/dL   Borderline  160-189  mg/dL   High  >190     mg/dL   Very High Performed at [facility] [address], [place], [place] [address]   TSH     Status: None   Collection Time: 05/20/20  6:54 AM  Result  Value Ref Range   TSH 1.068 0.350 - 4.500 uIU/mL    Comment: Performed by a 3rd Generation assay with a functional sensitivity of <=0.01 uIU/mL. Performed at [facility], [place] [address] [place], [place] [address]     Blood Alcohol level:  Lab Results  Component Value Date   ETH 44 (H) 33/83/2919    Metabolic Disorder Labs: Lab Results  Component Value Date   HGBA1C 5.2 05/20/2020   MPG 102.54 05/20/2020   No results found for: PROLACTIN Lab Results  Component Value Date   CHOL 199 05/20/2020   TRIG 113 05/20/2020   HDL 99 05/20/2020   CHOLHDL 2.0 05/20/2020   VLDL 23 05/20/2020   LDLCALC 77 05/20/2020    Physical Findings: AIMS: Facial and Oral Movements Muscles of Facial Expression: None, normal Lips and Perioral Area: None, normal Jaw: None, normal Tongue: None, normal,Extremity Movements Upper (arms, wrists, hands, fingers): None, normal Lower (legs, knees, ankles, toes): None, normal, Trunk Movements Neck, shoulders, hips: None, normal, Overall Severity Severity of abnormal movements (highest score from questions above):  None, normal Incapacitation due to abnormal movements: None, normal Patient's awareness of abnormal movements (rate only patient's report): No Awareness, Dental Status Current problems with teeth and/or dentures?: No Does patient usually wear dentures?: No  CIWA:  CIWA-Ar Total: 0 COWS:  COWS Total Score: 0  Musculoskeletal: Strength & Muscle Tone: within normal limits Gait & Station: normal Patient leans: N/A  Psychiatric Specialty Exam: Physical Exam Vitals and nursing note reviewed.  Constitutional:      Appearance: He is well-developed.  Cardiovascular:     Rate and Rhythm: Normal rate.  Pulmonary:     Effort: Pulmonary effort is normal.  Neurological:     Mental Status: He is alert and oriented to person, place, and time.     Review of Systems  Constitutional: Negative.   Respiratory: Negative for cough  and shortness of breath.   Psychiatric/Behavioral: Negative for agitation, behavioral problems, confusion, dysphoric mood, hallucinations, self-injury, sleep disturbance and suicidal ideas. The patient is not nervous/anxious and is not hyperactive.   No chest pain, no shortness of breath,  no vomiting  Blood pressure 120/79, pulse 76, temperature (!) 97.5 F (36.4 C), temperature source Oral, resp. rate 16, height '5\' 9"'  (1.753 m), weight 68 kg, SpO2 100 %.Body mass index is 22.15 kg/m.  General Appearance: Casual  Eye Contact:  Good  Speech:  Normal Rate  Volume:  Normal  Mood:  Endorses partial improvement compared to admission, describes some lingering depression/anxiety  Affect:  Vaguely anxious, reactive  Thought Process:  Linear and Descriptions of Associations: Intact  Orientation:  Other:  Fully alert and attentive  Thought Content:  No hallucinations, no delusions, not internally preoccupied  Suicidal Thoughts:  No denies suicidal or self-injurious ideations  Homicidal Thoughts:  No  Memory:  Recent and remote grossly intact  Judgement:  Fair/improving  Insight:  Fair  Psychomotor Activity:  Normal-no tremors, no diaphoresis, no psychomotor restlessness  Concentration:  Concentration: Good and Attention Span: Good  Recall:  Good  Fund of Knowledge:  Good  Language:  Good  Akathisia:  No  Handed:  Right  AIMS (if indicated):     Assets:  Communication Skills Desire for Improvement Resilience  ADL's:  Intact  Cognition:  WNL  Sleep:  Number of Hours: 6.75   Assessment: 24 year old male, history of substance use disorder, identifies opiates as substance of choice (was using opiate analgesics).  Patient UDS positive for amphetamines, opiates, cannabis, BAL 44.  He presented on 7/3 requesting detox, presenting depressed and endorsing passive SI.  Recent break-up with girlfriend after which he punched holes in the walls.   Reports partial improvement compared to admission.  At  this time not presenting with significant withdrawal symptoms and does not appear to be in any acute distress or discomfort with stable vitals.  Denies SI and presents future oriented.  Does endorse some persistent depression/anxiety and expresses interest in an antidepressant trial.  We discussed options and will start Remeron for depression/anxiety -may also help improve sleep quality which he reports is fair  Treatment Plan Summary: Daily contact with patient to assess and evaluate symptoms and progress in treatment and Medication management  Treatment plan reviewed as below today 7/6 Encourage group and milieu participation Encourage efforts to work on sobriety and relapse prevention Treatment team working on disposition planning options Continue Clonidine COWS protocol for opioid withdrawal Continue CIWA protocol with Ativan PRN CIWA>10 for ETOH withdrawal D/C Trazodone Start Remeron 7.5 mgrs QHS for depression, anxiety Continue Vistaril 25  mg PO Q6HR PRN anxiety   Jenne Campus, MD 05/21/2020, 3:00 PM   Patient ID: Dustin Miller, male   DOB: 06-23-1996, 24 y.o.   MRN: [id number]

## 2020-05-21 NOTE — Clinical Social Work Note (Signed)
Delivered the news to Dustin Miller that Dustin Miller would not be an option as he has Nurse, learning disability.  He absorbed the blow.  States his back up plan is [facility] IOP program.  Will get him an appointment there.

## 2020-05-21 NOTE — Progress Notes (Signed)
   05/21/20 1205  Vital Signs  Pulse Rate 76  BP 120/79  BP Location Right Arm  BP Method Automatic  Patient Position (if appropriate) Sitting  CIWA-Ar  Nausea and Vomiting 0  Tactile Disturbances 0  Tremor 0  Auditory Disturbances 0  Paroxysmal Sweats 0  Visual Disturbances 0  Anxiety 0  Headache, Fullness in Head 0  Agitation 0  Orientation and Clouding of Sensorium 0  CIWA-Ar Total 0   D: Patient presents with a depressed and anxious affect Patient denies SI/HI/AVH. Patient rates depression and anxiety 5/10. Patient isolated in his room. A:  Patient took scheduled medicine.  Support and encouragement provided Routine safety checks conducted every 15 minutes. Patient  Informed to notify staff with any concerns.   R: Safety maintained.

## 2020-05-22 MED ORDER — HYDROXYZINE HCL 25 MG PO TABS: 25.0000 mg | ORAL_TABLET | Freq: Four times a day (QID) | ORAL | 0 refills | Status: AC | PRN

## 2020-05-22 MED ORDER — BISACODYL 5 MG PO TBEC: 5.0000 mg | DELAYED_RELEASE_TABLET | Freq: Every day | ORAL | 0 refills | Status: AC | PRN

## 2020-05-22 MED ORDER — NICOTINE 21 MG/24HR TD PT24: 21.0000 mg | MEDICATED_PATCH | Freq: Every day | TRANSDERMAL | 0 refills | Status: AC

## 2020-05-22 MED ORDER — MIRTAZAPINE 7.5 MG PO TABS: 7.5000 mg | ORAL_TABLET | Freq: Every day | ORAL | 0 refills | Status: AC

## 2020-05-22 MED ORDER — POLYETHYLENE GLYCOL 3350 17 G PO PACK: 17.0000 g | PACK | Freq: Every day | ORAL | 0 refills | Status: AC

## 2020-05-22 NOTE — Progress Notes (Signed)
   05/22/20 0000  Psych Admission Type (Psych Patients Only)  Admission Status Voluntary  Psychosocial Assessment  Patient Complaints Anxiety  Eye Contact Fair  Facial Expression Flat  Affect Appropriate to circumstance  Speech Logical/coherent  Interaction Assertive  Motor Activity Other (Comment) (WDL)  Appearance/Hygiene Unremarkable  Behavior Characteristics Cooperative  Mood Depressed  Thought Process  Coherency WDL  Content WDL  Delusions None reported or observed  Perception WDL  Hallucination None reported or observed  Judgment Poor  Confusion None  Danger to Self  Current suicidal ideation? Denies  Danger to Others  Danger to Others None reported or observed

## 2020-05-22 NOTE — Progress Notes (Signed)
Discharge Note:  Patient denies SI/HI AVH at this time. Discharge instructions, AVS, prescriptions and transition record gone over with patient. Patient agrees to comply with medication management, follow-up visit, and outpatient therapy. Patient belongings returned to patient. Patient questions and concerns addressed and answered.  Patient ambulatory off unit.  Patient discharged to home with friend.   

## 2020-05-22 NOTE — BHH Suicide Risk Assessment (Signed)
[facility] Discharge Suicide Risk Assessment   Principal Problem: <principal problem not specified> Discharge Diagnoses: Active Problems:   Severe major depression, single episode ([facility])   Total Time spent with patient: 20 minutes  Musculoskeletal: Strength & Muscle Tone: within normal limits Gait & Station: normal Patient leans: N/A  Psychiatric Specialty Exam: Review of Systems  All other systems reviewed and are negative.   Blood pressure 101/82, pulse 82, temperature 97.7 F (36.5 C), temperature source Oral, resp. rate 16, height 5\' 9"  (1.753 m), weight 68 kg, SpO2 100 %.Body mass index is 22.15 kg/m.  General Appearance: Casual  Eye Contact::  Good  Speech:  Normal Rate409  Volume:  Normal  Mood:  Euthymic  Affect:  Congruent  Thought Process:  Coherent and Descriptions of Associations: Intact  Orientation:  Full (Time, Place, and Person)  Thought Content:  Logical  Suicidal Thoughts:  No  Homicidal Thoughts:  No  Memory:  Immediate;   Good Recent;   Good Remote;   Good  Judgement:  Intact  Insight:  Good  Psychomotor Activity:  Normal  Concentration:  Good  Recall:  Good  Fund of Knowledge:Good  Language: Good  Akathisia:  Negative  Handed:  Right  AIMS (if indicated):     Assets:  Desire for Improvement Housing Resilience Social Support  Sleep:  Number of Hours: 6.75  Cognition: WNL  ADL's:  Intact   Mental Status Per Nursing Assessment::   On Admission:  Suicidal ideation indicated by patient, Self-harm thoughts  Demographic Factors:  Male and Low socioeconomic status  Loss Factors: NA  Historical Factors: Impulsivity  Risk Reduction Factors:   Positive coping skills or problem solving skills  Continued Clinical Symptoms:  Alcohol/Substance Abuse/Dependencies  Cognitive Features That Contribute To Risk:  None    Suicide Risk:  Minimal: No identifiable suicidal ideation.  Patients presenting with no risk factors but with morbid ruminations;  may be classified as minimal risk based on the severity of the depressive symptoms   Follow-up Information    BEHAVIORAL HEALTH INTENSIVE CHEMICAL DEPENDENCY. Go on 05/28/2020.   Specialty: Behavioral Health Why: You have an appointment on 05/28/20 at 10:00 am.  This appointment will be held in person.  Please arrive no later than 9:30 am for this appointment. Contact information: [address] [place] [phone]              Plan Of Care/Follow-up recommendations:  Activity:  ad lib  [phone], MD 05/22/2020, 9:02 AM

## 2020-05-22 NOTE — Progress Notes (Signed)
°  [facility] Adult Case Management Discharge Plan :  Will you be returning to the same living situation after discharge:  Yes,  pt reports that he is going to his mothers home. At discharge, do you have transportation home?: Yes,  pt reports that family will provide transportation. Do you have the ability to pay for your medications: Yes,  BCBS  Release of information consent forms completed and in the chart;  Patient's signature needed at discharge.  Patient to Follow up at:  Follow-up Information    BEHAVIORAL HEALTH INTENSIVE CHEMICAL DEPENDENCY. Go on 05/28/2020.   Specialty: Behavioral Health Why: You have an appointment on 05/28/20 at 10:00 am.  This appointment will be held in person.  Please arrive no later than 9:30 am for this appointment. Contact information: [address] [id number] [place] [address] [phone]              Next level of care provider has access to [facility] Link:yes  Safety Planning and Suicide Prevention discussed: Yes,  SPE completed with the patient.   Have you used any form of tobacco in the last 30 days? (Cigarettes, Smokeless Tobacco, Cigars, and/or Pipes): Yes  Has patient been referred to the Quitline?: Patient refused referral  Patient has been referred for addiction treatment: Pt. refused referral  Harden Mo, LCSW 05/22/2020, 10:22 AM

## 2020-05-23 NOTE — Discharge Summary (Signed)
Physician Discharge Summary Note  Patient:  Dustin Miller is an 24 y.o., male MRN:  [id number] DOB:  Jul 27, 1996 Patient phone:  [phone] (home)  Patient address:   [address] [place] [address],  Total Time spent with patient: Greater than 30 minutes  Date of Admission:  05/19/2020  Date of Discharge: 05-23-20  Reason for Admission: Detoxification request, problem controlling emotions.  Principal Problem: Severe major depression, single episode [facility])  Discharge Diagnoses: Principal Problem:   Severe major depression, single episode (HCC) Active Problems:   Polysubstance (including opioids) dependence, daily use (HCC)  Past Psychiatric History: Major depressive disorder.  Past Medical History:  Past Medical History:  Diagnosis Date  . Migraine     Past Surgical History:  Procedure Laterality Date  . lymph nodes remove    . pyloric stenosis repair    . TONSILLECTOMY    . tubes in the ear     Family History:  Family History  Problem Relation Age of Onset  . Heart disease Mother    Family Psychiatric  History: See H&P  Social History:  Social History   Substance and Sexual Activity  Alcohol Use No     Social History   Substance and Sexual Activity  Drug Use Yes  . Types: Cocaine, Marijuana, Methamphetamines   Comment: Pills, Ectasy, Molly, Percs, Bars, "White Girl"    Social History   Socioeconomic History  . Marital status: Single    Spouse name: Not on file  . Number of children: Not on file  . Years of education: Not on file  . Highest education level: Not on file  Occupational History  . Not on file  Tobacco Use  . Smoking status: Never Smoker  . Smokeless tobacco: Never Used  Substance and Sexual Activity  . Alcohol use: No  . Drug use: Yes    Types: Cocaine, Marijuana, Methamphetamines    Comment: Pills, Ectasy, Molly, Percs, Bars, "White Girl"  . Sexual activity: Not on file  Other Topics Concern  . Not on file  Social  History Narrative  . Not on file   Social Determinants of Health   Financial Resource Strain:   . Difficulty of Paying Living Expenses:   Food Insecurity:   . Worried About Programme researcher, broadcasting/film/video in the Last Year:   . Barista in the Last Year:   Transportation Needs:   . Freight forwarder (Medical):   Marland Kitchen Lack of Transportation (Non-Medical):   Physical Activity:   . Days of Exercise per Week:   . Minutes of Exercise per Session:   Stress:   . Feeling of Stress :   Social Connections:   . Frequency of Communication with Friends and Family:   . Frequency of Social Gatherings with Friends and Family:   . Attends Religious Services:   . Active Member of Clubs or Organizations:   . Attends Banker Meetings:   Marland Kitchen Marital Status:    Hospital Course: (Per Md's admission evaluation notes): Patient is seen and examined. Patient is a 24 year old male who originally presented to the [facility] emergency department on 05/18/2020 requesting "detox from everything". Patient stated that recently has been unable to control his emotions. He recently broke up with a girlfriend and apparently tore up the house punching holes in walls. He stated he does not feel well with his anger, and has had passive suicidal ideation. He is very tearful during the interview. Prior to  college he denied any problems with depression, anxiety or substance abuse. He stated when he went to college he began drinking more heavily. In 2016 he began drinking every day, then progressed to using ecstasy and opiates as well. He stated most recently he drank alcohol, use cocaine, used opiates and ecstasy. His last drink was approximately 24 hours prior to when I saw the patient. The patient admitted to DUIs and having been on probation for that. He apparently came off probation in March or May 2021. He stated he had a significant family history for substance abuse and "everybody". He  also stated that his brother had a history of bipolar disorder. He was admitted to the hospital for evaluation and stabilization. He apparently has spoken to the folks at [facility], and has a bed at the rehabilitation facility once he is detox.  After the above admission evaluation, it was recommended based on his presenting symptoms that Joenathan will benefit from mood stabilization treatment. And with his consent, he was started on the medication regimen that targeted his symptoms. He was instructed & explained the benefit/adverse effects of the medication in use. He was given the time to ask questions and voice any concerns that he may have. He received, stabilized & was discharged on the medications as listed below on his discharge medication lists. He was also enrolled & participated in the group counseling sessions being offered and held on this unit. Gee learned coping skills that should help him after discharge to cope better & maintain mood stability/sobriety. He presented other medical issues that required treatments. He was treated & discharged on all the treatment regimen for those medical issues. He tolerated his treatment regimen without any adverse effects or reactions reported.  Jaishon's symptoms responded well to his treatment regimen. This is evidenced by his daily reports of improved mood, absence of suicidal ideations, homicidal ideations & or AV hallucinations. He is currently mentally & medically stable to be discharged to continue mental health care, medication management & substance abuse treatment as noted below.  During the course of his hospitalization, the 15-minute checks were adequate to ensure patient's safety. Patient did not display any dangerous, violent or suicidal behavior on the unit.  He interacted with patients & staff appropriately and participated appropriately in the group therapy sessions.  His medications were addressed & adjusted to meet his needs.  At the time of  discharge patient is not reporting any acute suicidal ideation & feels more confident about his self-care and managing suicidal thoughts/mental health.  Denies homicidal ideations.  Education and supportive counseling provided. He was able to engage in safety planning including plan to return to [facility] or contact emergency services if he feels unable to maintain his own safety or the safety of others. Pt had no further questions, comments, or concerns. He left [facility] with all personal belongings in no apparent distress. Transportation per his family.  Physical Findings: AIMS: Facial and Oral Movements Muscles of Facial Expression: None, normal Lips and Perioral Area: None, normal Jaw: None, normal Tongue: None, normal,Extremity Movements Upper (arms, wrists, hands, fingers): None, normal Lower (legs, knees, ankles, toes): None, normal, Trunk Movements Neck, shoulders, hips: None, normal, Overall Severity Severity of abnormal movements (highest score from questions above): None, normal Incapacitation due to abnormal movements: None, normal Patient's awareness of abnormal movements (rate only patient's report): No Awareness, Dental Status Current problems with teeth and/or dentures?: No Does patient usually wear dentures?: No  CIWA:  CIWA-Ar Total: 0 COWS:  COWS Total Score: 0  Musculoskeletal: Strength & Muscle Tone: within normal limits Gait & Station: normal Patient leans: N/A  Psychiatric Specialty Exam: Physical Exam Vitals and nursing note reviewed.  HENT:     Head: Normocephalic.     Nose: Nose normal.     Mouth/Throat:     Pharynx: Oropharynx is clear.  Eyes:     Pupils: Pupils are equal, round, and reactive to light.  Cardiovascular:     Rate and Rhythm: Normal rate.     Pulses: Normal pulses.  Pulmonary:     Effort: Pulmonary effort is normal.  Genitourinary:    Comments: Deferred Musculoskeletal:        General: Normal range of motion.     Cervical back: Normal range of  motion.  Skin:    General: Skin is warm and dry.  Neurological:     Mental Status: He is alert and oriented to person, place, and time.     Review of Systems  Constitutional: Negative for chills, diaphoresis and fever.  HENT: Negative for congestion, rhinorrhea, sneezing and sore throat.   Eyes: Negative for discharge.  Respiratory: Negative for cough, chest tightness, shortness of breath and wheezing.   Cardiovascular: Negative for chest pain and palpitations.  Gastrointestinal: Negative for diarrhea, nausea and vomiting.  Endocrine: Negative for cold intolerance.  Genitourinary: Negative for difficulty urinating.  Musculoskeletal: Negative for arthralgias and myalgias.  Skin: Negative for color change.  Allergic/Immunologic:       Allergies: NKDA  Neurological: Negative for dizziness, tremors, seizures, syncope, speech difficulty, light-headedness and headaches.  Psychiatric/Behavioral: Positive for dysphoric mood (Stabilized with medication prior to discharge)) and sleep disturbance (Stabilized with medication prior to discharge). Negative for agitation, behavioral problems, confusion, decreased concentration, hallucinations, self-injury and suicidal ideas. The patient is not nervous/anxious (Stable) and is not hyperactive.     Blood pressure 101/82, pulse 82, temperature 97.7 F (36.5 C), temperature source Oral, resp. rate 16, height 5\' 9"  (1.753 m), weight 68 kg, SpO2 100 %.Body mass index is 22.15 kg/m.  See Md's discharge SRA  Sleep:  Number of Hours: 6.75   Have you used any form of tobacco in the last 30 days? (Cigarettes, Smokeless Tobacco, Cigars, and/or Pipes): Yes  Has this patient used any form of tobacco in the last 30 days? (Cigarettes, Smokeless Tobacco, Cigars, and/or Pipes):Yes, an FDA-approved tobacco cessation medication was recommended at discharge.  Blood Alcohol level:  Lab Results  Component Value Date   ETH 44 (H) 05/18/2020    Metabolic Disorder Labs:   Lab Results  Component Value Date   HGBA1C 5.2 05/20/2020   MPG 102.54 05/20/2020   No results found for: PROLACTIN Lab Results  Component Value Date   CHOL 199 05/20/2020   TRIG 113 05/20/2020   HDL 99 05/20/2020   CHOLHDL 2.0 05/20/2020   VLDL 23 05/20/2020   LDLCALC 77 05/20/2020   See Psychiatric Specialty Exam and Suicide Risk Assessment completed by Attending Physician prior to discharge.  Discharge destination:  Home  Is patient on multiple antipsychotic therapies at discharge:  No   Has Patient had three or more failed trials of antipsychotic monotherapy by history:  No  Recommended Plan for Multiple Antipsychotic Therapies: NA  Allergies as of 05/22/2020   No Known Allergies     Medication List    STOP taking these medications   fluticasone 50 MCG/ACT nasal spray Commonly known as: FLONASE   montelukast 10 MG tablet Commonly known as: Singulair  TAKE these medications     Indication  bisacodyl 5 MG EC tablet Commonly known as: DULCOLAX Take 1 tablet (5 mg total) by mouth daily as needed. (May buy from over the counter): For consitaptaion  Indication: Constipation   cetirizine 10 MG tablet Commonly known as: ZyrTEC Allergy Take 1 tablet (10 mg total) by mouth daily.  Indication: Perennial Allergic Rhinitis, Hayfever   hydrOXYzine 25 MG tablet Commonly known as: ATARAX/VISTARIL Take 1 tablet (25 mg total) by mouth every 6 (six) hours as needed for anxiety.  Indication: Feeling Anxious   mirtazapine 7.5 MG tablet Commonly known as: REMERON Take 1 tablet (7.5 mg total) by mouth at bedtime. For depression/sleep  Indication: Major Depressive Disorder, Insomnia   nicotine 21 mg/24hr patch Commonly known as: NICODERM CQ - dosed in mg/24 hours Place 1 patch (21 mg total) onto the skin daily. (may buy from over the counter): For smoking cessation  Indication: Nicotine Addiction   polyethylene glycol 17 g packet Commonly known as: MIRALAX /  GLYCOLAX Take 17 g by mouth daily. (May buy from over the counter): For constipation  Indication: Constipation       Follow-up Information    BEHAVIORAL HEALTH INTENSIVE CHEMICAL DEPENDENCY. Go on 05/28/2020.   Specialty: Behavioral Health Why: You have an appointment on 05/28/20 at 10:00 am.  This appointment will be held in person.  Please arrive no later than 9:30 am for this appointment. Contact information: [address] [id number] [place] [id number] [phone]             Follow-up recommendations: Activity:  As tolerated Diet: As recommended by your primary care doctor. Keep all scheduled follow-up appointments as recommended.   Comments: Prescriptions given at discharge.  Patient agreeable to plan.  Given opportunity to ask questions.  Appears to feel comfortable with discharge denies any current suicidal or homicidal thought. Patient is also instructed prior to discharge to: Take all medications as prescribed by his/her mental healthcare provider. Report any adverse effects and or reactions from the medicines to his/her outpatient provider promptly. Patient has been instructed & cautioned: To not engage in alcohol and or illegal drug use while on prescription medicines. In the event of worsening symptoms, patient is instructed to call the crisis hotline, 911 and or go to the nearest ED for appropriate evaluation and treatment of symptoms. To follow-up with his/her primary care provider for your other medical issues, concerns and or health care needs.  Signed: Armandina StammerAgnes Esraa Seres, NP, PMHNP, FNP-BC 05/23/2020, 1:37 PM

## 2020-05-28 ENCOUNTER — Ambulatory Visit (HOSPITAL_COMMUNITY): Payer: BC Managed Care – PPO | Admitting: Licensed Clinical Social Worker

## 2020-05-31 ENCOUNTER — Other Ambulatory Visit: Payer: Self-pay

## 2020-05-31 ENCOUNTER — Ambulatory Visit (INDEPENDENT_AMBULATORY_CARE_PROVIDER_SITE_OTHER): Payer: BC Managed Care – PPO | Admitting: Licensed Clinical Social Worker

## 2020-05-31 DIAGNOSIS — F1994 Other psychoactive substance use, unspecified with psychoactive substance-induced mood disorder: Secondary | ICD-10-CM

## 2020-05-31 DIAGNOSIS — F1120 Opioid dependence, uncomplicated: Secondary | ICD-10-CM | POA: Diagnosis not present

## 2020-05-31 DIAGNOSIS — F1210 Cannabis abuse, uncomplicated: Secondary | ICD-10-CM | POA: Diagnosis not present

## 2020-05-31 DIAGNOSIS — F1020 Alcohol dependence, uncomplicated: Secondary | ICD-10-CM | POA: Diagnosis not present

## 2020-05-31 DIAGNOSIS — F1410 Cocaine abuse, uncomplicated: Secondary | ICD-10-CM
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
## 2020-06-03 ENCOUNTER — Encounter (HOSPITAL_COMMUNITY): Payer: Self-pay | Admitting: Medical

## 2020-06-03 ENCOUNTER — Other Ambulatory Visit: Payer: Self-pay

## 2020-06-03 ENCOUNTER — Other Ambulatory Visit (HOSPITAL_COMMUNITY): Payer: BC Managed Care – PPO | Attending: Psychiatry | Admitting: Licensed Clinical Social Worker

## 2020-06-03 VITALS — BP 110/80 | HR 76 | Ht 69.0 in | Wt 150.0 lb

## 2020-06-03 DIAGNOSIS — R7989 Other specified abnormal findings of blood chemistry: Secondary | ICD-10-CM | POA: Diagnosis not present

## 2020-06-03 DIAGNOSIS — Z9189 Other specified personal risk factors, not elsewhere classified: Secondary | ICD-10-CM

## 2020-06-03 DIAGNOSIS — F1920 Other psychoactive substance dependence, uncomplicated: Secondary | ICD-10-CM | POA: Insufficient documentation

## 2020-06-03 DIAGNOSIS — Z6372 Alcoholism and drug addiction in family: Secondary | ICD-10-CM | POA: Diagnosis not present

## 2020-06-03 DIAGNOSIS — F4312 Post-traumatic stress disorder, chronic: Secondary | ICD-10-CM | POA: Diagnosis not present

## 2020-06-03 DIAGNOSIS — R454 Irritability and anger: Secondary | ICD-10-CM | POA: Diagnosis not present

## 2020-06-03 DIAGNOSIS — F1994 Other psychoactive substance use, unspecified with psychoactive substance-induced mood disorder: Secondary | ICD-10-CM | POA: Diagnosis not present

## 2020-06-03 DIAGNOSIS — F1120 Opioid dependence, uncomplicated: Secondary | ICD-10-CM | POA: Diagnosis not present

## 2020-06-03 MED ORDER — NALTREXONE HCL 50 MG PO TABS: 50.0000 mg | ORAL_TABLET | Freq: Every day | ORAL | 0 refills | Status: AC

## 2020-06-03 MED ORDER — BACLOFEN 10 MG PO TABS: 10.0000 mg | ORAL_TABLET | Freq: Three times a day (TID) | ORAL | 1 refills | Status: AC
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
## 2020-06-05 ENCOUNTER — Other Ambulatory Visit: Payer: Self-pay

## 2020-06-05 ENCOUNTER — Other Ambulatory Visit (HOSPITAL_COMMUNITY): Payer: BC Managed Care – PPO | Admitting: Licensed Clinical Social Worker

## 2020-06-05 DIAGNOSIS — F1010 Alcohol abuse, uncomplicated: Secondary | ICD-10-CM

## 2020-06-05 DIAGNOSIS — F4312 Post-traumatic stress disorder, chronic: Secondary | ICD-10-CM

## 2020-06-05 DIAGNOSIS — F1120 Opioid dependence, uncomplicated: Secondary | ICD-10-CM | POA: Diagnosis not present

## 2020-06-05 DIAGNOSIS — F1920 Other psychoactive substance dependence, uncomplicated: Secondary | ICD-10-CM

## 2020-06-05 DIAGNOSIS — Z6372 Alcoholism and drug addiction in family: Secondary | ICD-10-CM

## 2020-06-05 NOTE — Progress Notes (Signed)
    Daily Group Progress Note  Program: CD-IOP   Group Time: 1pm-2:30pm Participation Level: Active Behavioral Response: Appropriate Type of Therapy: Process Group Topic: Clinician met with group members, assessing for SI/HI/psychosis and overall level of functioning. Clinician inquired about recent sobriety date and number of 12 step community meetings attended. Clinician and group members processed things going well and not well over the weekend as well as any recent triggers or challenges to sobriety. Clinician and group members read and discussed NA Just for Today and AA Daily reflection. Clinician provided mindfulness activity for group participation in session.  Group Time: 2:30pm-4pm Participation Level: Active Behavioral Response: Appropriate Type of Therapy: Psycho-education Group Topic: Clinician presented the psycho-educational session topic of boundaries. Clinician and group members reviewed traits of loose, rigid and healthy boundaries and discussed examples of each. Clinician and group discussed what healthy vs unhealthy boundaries could look like and factors effecting setting and maintaining boundaries. Clinician and group members reviewed common challenges to maintaining boundaries and problem solved tools to enforce healthy boundaries. Clinician and group members discussed how boundaries were effected by substance use and goals for new boundaries to implement in early recovery.    Summary: Client reported sobriety date of today, 06/03/20. Client reported having 1 beer each day over the weekend due to anxiety around discussions with mother. Client reported a priority of setting boundaries with his mother and addressing substance use around him. Client was receptive to information provided in session and able to identify healthy and non-healthy boundaries in areas of his life and how this was effected by substance use and family behaviors seeing growing up. See PA note for  additional details.   Family Program: Family present? No   Name of family member(s): NA  UDS collected: Yes Results: pending  AA/NA attended?: No, receptive to resources for AA mtgs  Sponsor?: No   Olegario Messier, LCSW

## 2020-06-06 ENCOUNTER — Other Ambulatory Visit (HOSPITAL_COMMUNITY): Payer: BC Managed Care – PPO | Admitting: Licensed Clinical Social Worker

## 2020-06-06 ENCOUNTER — Other Ambulatory Visit: Payer: Self-pay

## 2020-06-06 DIAGNOSIS — F1120 Opioid dependence, uncomplicated: Secondary | ICD-10-CM | POA: Diagnosis not present

## 2020-06-06 DIAGNOSIS — F4312 Post-traumatic stress disorder, chronic: Secondary | ICD-10-CM

## 2020-06-06 DIAGNOSIS — F1010 Alcohol abuse, uncomplicated: Secondary | ICD-10-CM

## 2020-06-06 DIAGNOSIS — F1994 Other psychoactive substance use, unspecified with psychoactive substance-induced mood disorder: Secondary | ICD-10-CM

## 2020-06-10 ENCOUNTER — Encounter (HOSPITAL_COMMUNITY): Payer: BC Managed Care – PPO | Admitting: Medical

## 2020-06-12 ENCOUNTER — Encounter (HOSPITAL_COMMUNITY): Payer: BC Managed Care – PPO

## 2020-06-13 ENCOUNTER — Telehealth (HOSPITAL_COMMUNITY): Payer: Self-pay | Admitting: Licensed Clinical Social Worker

## 2020-06-13 ENCOUNTER — Encounter (HOSPITAL_COMMUNITY): Payer: BC Managed Care – PPO

## 2020-06-13 NOTE — Progress Notes (Addendum)
    Daily Group Progress Note  Program: CD-IOP   Group Time: 1pm-2pm Participation Level: Active Behavioral Response: Appropriate Type of Therapy: Process Group  Topic: Clinician checked in with group members, assessing for SI/HI/psychosis and overall level of functioning including relapse and barriers to recovery. Clinician and group members discussed highlights and struggles since last group related to maintaining sobriety including identified triggers and responses related to events. Clinician and group members processed Daily Reflection and JFT focused on surrendering, and  personal meaning to current work in recovery. Clinician provided mindfulness activity.   Group Time: 2pm-4pm Participation Level: Active Behavioral Response: Appropriate Type of Therapy: Psycho-educational Group  Topic: Process group included clinician presented the topic of assertive communication. Clinician and group members discussed traits of passive, aggressive, and assertive communication. Clinician and group members reviewed communication roadblocks, Interpersonal Effectiveness skills and role played scenarios in session. Clinician and group members discussed possibilities or relationship changes based on use of assertive communication to maintain healthy boundaries.   Summary: Client checked in with sobriety date of 06/03/20.  Client identified goal of improving boundaries with mom and others in the home related to substance use around him and identified phrases for healthy communication to help verbalize needs to others. Client shared thoughts on surrendering and pride vs humility when seeking help.   Family Program: Family present? No   Name of family member(s): NA  UDS collected: No Results: previous UDS positive for prescribed remeron only  AA/NA attended?: No  Sponsor?: No   Harlon Ditty, LCSW

## 2020-06-17 ENCOUNTER — Encounter (HOSPITAL_COMMUNITY): Payer: BC Managed Care – PPO | Admitting: Medical

## 2020-06-17 ENCOUNTER — Telehealth (HOSPITAL_COMMUNITY): Payer: Self-pay | Admitting: Licensed Clinical Social Worker

## 2020-06-18 NOTE — Progress Notes (Signed)
    Daily Group Progress Note  Program: CD-IOP   Group Time: 1pm-2:30pm Participation Level: Active Behavioral Response: Appropriate Type of Therapy: Process Group   Topic: Clinician checked in with group members, assessing for SI/HI/psychosis and overall level of functioning. Clinician and group members processed events of the weekend, including highlights and moments of struggle. Clinician and group members processed related thoughts and feelings and coping skills used to address. Clinician and group members read and processed Daily Reflection and relation to current stage of recovery.     Group Time: 2:30pm-4pm Participation Level: Active Behavioral Response: Appropriate Type of Therapy: Psycho-education Group   Topic: Clinician presented the topic of vulnerability, specifically related to early recovery. Clinician presented supplemental material from Dewain Penning to process benefits and barriers to vulnerability. Clinician and group members discussed the importance of making connections with others in recovery. Clinician challenged clients to identify a positive self trait which they see as a strength. Clinician actively listened to clients, providing summarizing statements and validating feelings. Clinician inquired about self care activity planned for this day. Group celebrated successful graduation of one member.    Summary: Client reported sobriety date of 06/03/20. Client reported cravings/temptations due to recent stressors to drink but was able to avoid use per his report. Client shared feeling isolated due to 'cutting off' most of his friends due to their ongoing substance use and his efforts to avoid knowingly high risk situations. Client reported he showed vulnerability with talking to his mother more and plans to attempt to discuss boundaries again over the weekend. Client shows improvement toward goals AEB increased engagement in group and willingness to share thoughts. Client  is receptive to group feedback of the usefulness of AA or NA to build new, supportive, sober, connections.   Family Program: Family present? No   Name of family member(s): NA  UDS collected: No Results: previous uds positive for prescribed Remeron and nicotine only  AA/NA attended?: No  Sponsor?: No   Harlon Ditty, LCSW

## 2020-06-19 ENCOUNTER — Encounter (HOSPITAL_COMMUNITY): Payer: Self-pay | Admitting: Medical

## 2020-06-19 ENCOUNTER — Ambulatory Visit (INDEPENDENT_AMBULATORY_CARE_PROVIDER_SITE_OTHER): Payer: BC Managed Care – PPO | Admitting: Medical

## 2020-06-19 DIAGNOSIS — F4312 Post-traumatic stress disorder, chronic: Secondary | ICD-10-CM

## 2020-06-19 DIAGNOSIS — Z9119 Patient's noncompliance with other medical treatment and regimen: Secondary | ICD-10-CM

## 2020-06-19 DIAGNOSIS — F1120 Opioid dependence, uncomplicated: Secondary | ICD-10-CM

## 2020-06-19 DIAGNOSIS — R454 Irritability and anger: Secondary | ICD-10-CM

## 2020-06-19 DIAGNOSIS — Z9189 Other specified personal risk factors, not elsewhere classified: Secondary | ICD-10-CM

## 2020-06-19 DIAGNOSIS — F1920 Other psychoactive substance dependence, uncomplicated: Secondary | ICD-10-CM

## 2020-06-19 DIAGNOSIS — Z6372 Alcoholism and drug addiction in family: Secondary | ICD-10-CM

## 2020-06-19 DIAGNOSIS — R7989 Other specified abnormal findings of blood chemistry: Secondary | ICD-10-CM

## 2020-06-19 NOTE — Telephone Encounter (Signed)
See above. No contact made.
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
## 2020-06-20 ENCOUNTER — Encounter (HOSPITAL_COMMUNITY): Payer: BC Managed Care – PPO

## 2020-06-20 ENCOUNTER — Telehealth (HOSPITAL_COMMUNITY): Payer: Self-pay | Admitting: Licensed Clinical Social Worker

## 2020-06-20 NOTE — Telephone Encounter (Signed)
Client will be mailed letter requesting contact within a week or be eligible for discharged due to non-compliance.

## 2020-06-24 ENCOUNTER — Encounter (HOSPITAL_COMMUNITY): Payer: BC Managed Care – PPO

## 2020-06-26 ENCOUNTER — Encounter (HOSPITAL_COMMUNITY): Payer: Self-pay | Admitting: Medical

## 2020-06-26 ENCOUNTER — Ambulatory Visit (HOSPITAL_COMMUNITY): Payer: BC Managed Care – PPO | Admitting: Medical
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
## 2020-06-27 ENCOUNTER — Encounter (HOSPITAL_COMMUNITY): Payer: BC Managed Care – PPO

## 2020-07-01 ENCOUNTER — Encounter (HOSPITAL_COMMUNITY): Payer: BC Managed Care – PPO

## 2020-07-03 ENCOUNTER — Encounter (HOSPITAL_COMMUNITY): Payer: BC Managed Care – PPO

## 2020-07-04 ENCOUNTER — Encounter (HOSPITAL_COMMUNITY): Payer: BC Managed Care – PPO

## 2020-07-08 ENCOUNTER — Encounter (HOSPITAL_COMMUNITY): Payer: BC Managed Care – PPO

## 2020-07-10 ENCOUNTER — Encounter (HOSPITAL_COMMUNITY): Payer: BC Managed Care – PPO

## 2020-07-11 ENCOUNTER — Encounter (HOSPITAL_COMMUNITY): Payer: BC Managed Care – PPO

## 2020-07-15 ENCOUNTER — Encounter (HOSPITAL_COMMUNITY): Payer: BC Managed Care – PPO

## 2020-07-17 ENCOUNTER — Encounter (HOSPITAL_COMMUNITY): Payer: BC Managed Care – PPO

## 2020-07-18 ENCOUNTER — Encounter (HOSPITAL_COMMUNITY): Payer: BC Managed Care – PPO

## 2023-02-20 ENCOUNTER — Emergency Department (HOSPITAL_COMMUNITY)
Admission: EM | Admit: 2023-02-20 | Discharge: 2023-03-17 | Disposition: E | Payer: 59 | Attending: Emergency Medicine | Admitting: Emergency Medicine

## 2023-02-20 DIAGNOSIS — S0180XA Unspecified open wound of other part of head, initial encounter: Secondary | ICD-10-CM | POA: Insufficient documentation

## 2023-02-20 DIAGNOSIS — S0990XA Unspecified injury of head, initial encounter: Secondary | ICD-10-CM | POA: Diagnosis present

## 2023-02-20 DIAGNOSIS — S01302A Unspecified open wound of left ear, initial encounter: Secondary | ICD-10-CM | POA: Diagnosis not present

## 2023-02-20 DIAGNOSIS — W3400XA Accidental discharge from unspecified firearms or gun, initial encounter: Secondary | ICD-10-CM | POA: Insufficient documentation

## 2023-02-20 DIAGNOSIS — Y92810 Car as the place of occurrence of the external cause: Secondary | ICD-10-CM | POA: Diagnosis not present

## 2023-02-25 ENCOUNTER — Telehealth: Payer: Self-pay | Admitting: *Deleted

## 2023-02-25 NOTE — Telephone Encounter (Signed)
Pt mom called regarding son's medical staff.  Mom states she was not approached by medical staff during her son's last admission.  RNCM advised to contact  Medical Records.
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
## 2023-03-17 NOTE — Code Documentation (Signed)
Bedside cardiac US performed by MD Janee Morn
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
## 2023-03-17 NOTE — Code Documentation (Signed)
Patient time of death occurred at 2.
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
## 2023-03-17 NOTE — Progress Notes (Signed)
   02/16/2023 0250  Spiritual Encounters  Type of Visit Initial  Care provided to: Pt and family  Conversation partners present during encounter Nurse  Referral source Trauma page  Reason for visit Trauma  OnCall Visit Yes   Chaplain responded to Trauma 1 page to find PT had expired.  At the time there was no family present.  Chaplain was then asked to accompany [employer] in meeting PT's mother in the waiting area to inform her of PT's death.  Chaplain stayed with family and friends to provide compassionate presence, emotional support, grief counseling, and prayer.
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
## 2023-03-17 DEATH — deceased
